# Patient Record
Sex: Female | Born: 1970 | Race: White | Hispanic: No | Marital: Married | Smoking: Current some day smoker
Health system: Southern US, Community
[De-identification: ages and names within clinical notes are randomized; demographics above are authoritative.]

## PROBLEM LIST (undated history)

## (undated) DIAGNOSIS — T7840XA Allergy, unspecified, initial encounter: Secondary | ICD-10-CM

## (undated) HISTORY — DX: Allergy, unspecified, initial encounter: T78.40XA

## (undated) HISTORY — PX: TUBAL LIGATION: SHX77

## (undated) HISTORY — PX: TONSILLECTOMY: SUR1361

---

## 2013-06-28 ENCOUNTER — Encounter: Payer: Self-pay | Admitting: Family Medicine

## 2013-06-28 ENCOUNTER — Ambulatory Visit (INDEPENDENT_AMBULATORY_CARE_PROVIDER_SITE_OTHER): Admitting: Family Medicine

## 2013-06-28 VITALS — BP 126/64 | HR 78 | Temp 98.2°F | Resp 16 | Wt 135.0 lb

## 2013-06-28 DIAGNOSIS — J302 Other seasonal allergic rhinitis: Secondary | ICD-10-CM | POA: Insufficient documentation

## 2013-06-28 DIAGNOSIS — J019 Acute sinusitis, unspecified: Secondary | ICD-10-CM

## 2013-06-28 MED ORDER — FLUTICASONE PROPIONATE 50 MCG/ACT NA SUSP: 2.0000 | Freq: Every day | NASAL | Status: DC

## 2013-06-28 MED ORDER — AMOXICILLIN-POT CLAVULANATE 875-125 MG PO TABS: 1.0000 | ORAL_TABLET | Freq: Two times a day (BID) | ORAL | Status: DC

## 2013-06-28 MED ORDER — FLUCONAZOLE 150 MG PO TABS: 150.0000 mg | ORAL_TABLET | Freq: Once | ORAL | Status: DC

## 2013-06-28 NOTE — Progress Notes (Signed)
  Subjective:    Patient ID: April Mcmillan, female    DOB: 11-11-1971, 42 y.o.   MRN: [id number]  HPI  Patient presents with 3 weeks of bilateral maxillary sinus pain and pressure. She has a history of chronic allergies. She is currently taking Zyrtec and over-the-counter with little relief. She's been trying over-the-counter Sudafed which has helped some.  She is also trying nasal saline 4 times a day with minimal relief. She denies any fevers or chills. She does report headaches. She denies any sore throat cough.  She is having itchy watery eyes and sneezing. Past Medical History  Diagnosis Date  . Allergy    No current outpatient prescriptions on file prior to visit.   No current facility-administered medications on file prior to visit.   No Known Allergies History   Social History  . Marital Status: Married    Spouse Name: N/A    Number of Children: N/A  . Years of Education: N/A   Occupational History  . Not on file.   Social History Main Topics  . Smoking status: Never Smoker   . Smokeless tobacco: Not on file  . Alcohol Use: Not on file  . Drug Use: Not on file  . Sexually Active: Not on file   Other Topics Concern  . Not on file   Social History Narrative  . No narrative on file     Review of Systems  All other systems reviewed and are negative.       Objective:   Physical Exam  Vitals reviewed. Constitutional: She appears well-developed and well-nourished.  HENT:  Right Ear: Tympanic membrane, external ear and ear canal normal.  Left Ear: Tympanic membrane, external ear and ear canal normal.  Nose: Mucosal edema and rhinorrhea present. Right sinus exhibits maxillary sinus tenderness and frontal sinus tenderness. Left sinus exhibits maxillary sinus tenderness and frontal sinus tenderness.  Mouth/Throat: Oropharynx is clear and moist.  Eyes: Conjunctivae are normal. Pupils are equal, round, and reactive to light.  Cardiovascular: Normal rate, regular  rhythm and normal heart sounds.   No murmur heard. Pulmonary/Chest: Effort normal and breath sounds normal. No respiratory distress. She has no wheezes. She has no rales.          Assessment & Plan:  1. Acute rhinosinusitis - amoxicillin-clavulanate (AUGMENTIN) 875-125 MG per tablet; Take 1 tablet by mouth 2 (two) times daily.  Dispense: 20 tablet; Refill: 0 - fluticasone (FLONASE) 50 MCG/ACT nasal spray; Place 2 sprays into the nose daily.  Dispense: 16 g; Refill: 6 Asked the patient continue nasal saline 4 times a day. I also asked her to discontinue Zyrtec and use Flonase instead to try to facilitate drainage of the sinuses.  Did call prescription for Diflucan to her pharmacy in case she gets a secondary yeast infection.

## 2013-09-07 ENCOUNTER — Ambulatory Visit (INDEPENDENT_AMBULATORY_CARE_PROVIDER_SITE_OTHER): Admitting: Family Medicine

## 2013-09-07 ENCOUNTER — Encounter: Payer: Self-pay | Admitting: Family Medicine

## 2013-09-07 VITALS — BP 128/78 | HR 78 | Temp 98.4°F | Resp 14 | Ht 63.0 in | Wt 130.0 lb

## 2013-09-07 DIAGNOSIS — J019 Acute sinusitis, unspecified: Secondary | ICD-10-CM | POA: Insufficient documentation

## 2013-09-07 MED ORDER — FLUTICASONE PROPIONATE 50 MCG/ACT NA SUSP: 2.0000 | Freq: Every day | NASAL | Status: DC

## 2013-09-07 MED ORDER — AMOXICILLIN-POT CLAVULANATE 875-125 MG PO TABS: 1.0000 | ORAL_TABLET | Freq: Two times a day (BID) | ORAL | Status: DC

## 2013-09-07 MED ORDER — FLUCONAZOLE 150 MG PO TABS: 150.0000 mg | ORAL_TABLET | Freq: Once | ORAL | Status: DC

## 2013-09-07 NOTE — Assessment & Plan Note (Signed)
Treat with Augmentin, decongestant, Flonase

## 2013-09-07 NOTE — Progress Notes (Signed)
  Subjective:    Patient ID: Lemoyne Scarpati, female    DOB: 03-02-1971, 42 y.o.   MRN: [id number]  HPI Patient here with sinus drainage and sinus pressure for the past 3 weeks. She has a history of severe allergies. She initially thought the symptoms were due to her allergies. Her mucus is now gone from thin clear to a thick yellow. She'll he has cough and she has mucus that goes down the back of her throat she needs to cough up. She denies any fever. She's used her Flonase a few days and also using her regular allergy medication.   Review of Systems  GEN- denies fatigue, fever, weight loss,weakness, recent illness HEENT- denies eye drainage, change in vision, +nasal discharge,+ear pressure CVS- denies chest pain, palpitations RESP- denies SOB, +cough, wheeze Neuro- denies headache, dizziness, syncope, seizure activity      Objective:   Physical Exam GEN- NAD, alert and oriented x3 HEENT- PERRL, EOMI, non injected sclera, pink conjunctiva, MMM, oropharynx clear, TM clear bilat no effusion,  + maxillary sinus tenderness, inflammed turbinates,  Nasal drainage  Neck- Supple, no LAD CVS- RRR, no murmur RESP-CTAB Pulses- Radial 2+         Assessment & Plan:

## 2013-09-07 NOTE — Patient Instructions (Signed)
Decongest with Sudafed You can take mucniex to thin the mucous Antibiotics prescribed Flonase Call if you do not improve

## 2013-11-17 ENCOUNTER — Ambulatory Visit (INDEPENDENT_AMBULATORY_CARE_PROVIDER_SITE_OTHER): Admitting: Physician Assistant

## 2013-11-17 ENCOUNTER — Encounter: Payer: Self-pay | Admitting: Physician Assistant

## 2013-11-17 VITALS — BP 116/70 | HR 72 | Temp 98.2°F | Resp 18 | Wt 131.0 lb

## 2013-11-17 DIAGNOSIS — J988 Other specified respiratory disorders: Secondary | ICD-10-CM

## 2013-11-17 DIAGNOSIS — J019 Acute sinusitis, unspecified: Secondary | ICD-10-CM

## 2013-11-17 DIAGNOSIS — A499 Bacterial infection, unspecified: Secondary | ICD-10-CM

## 2013-11-17 MED ORDER — FLUTICASONE PROPIONATE 50 MCG/ACT NA SUSP: 2.0000 | Freq: Every day | NASAL | Status: DC

## 2013-11-17 MED ORDER — CEFDINIR 300 MG PO CAPS: 300.0000 mg | ORAL_CAPSULE | Freq: Two times a day (BID) | ORAL | Status: DC

## 2013-11-17 NOTE — Progress Notes (Signed)
    Patient ID: April Mcmillan MRN: [id number], DOB: 1971-08-09, 42 y.o. Date of Encounter: 11/17/2013, 4:06 PM    Chief Complaint:  Chief Complaint  Patient presents with  . c/o sinus infection    facial pain, HA, congestion     HPI: 42 y.o. year old white female says she has been sick for 2-3 weeks. Not blowing much from nose. Coughing up a lot of phlegm. No sore throat, ear ache, fever, chills.     Home Meds: See attached medication section for any medications that were entered at today's visit. The computer does not put those onto this list.The following list is a list of meds entered prior to today's visit.   Current Outpatient Prescriptions on File Prior to Visit  Medication Sig Dispense Refill  . Phenylephrine-DM-GG-APAP (MUCINEX FAST-MAX COLD FLU) 5-10-200-325 MG TABS Take by mouth.       No current facility-administered medications on file prior to visit.    Allergies: No Known Allergies    Review of Systems: See HPI for pertinent ROS. All other ROS negative.    Physical Exam: Blood pressure 116/70, pulse 72, temperature 98.2 F (36.8 C), temperature source Oral, resp. rate 18, weight 131 lb (59.421 kg), last menstrual period 11/03/2013., Body mass index is 23.21 kg/(m^2). General: WNWD WF Appears in no acute distress. HEENT: Normocephalic, atraumatic, eyes without discharge, sclera non-icteric, nares are without discharge. Bilateral auditory canals clear, TM's are without perforation, pearly grey and translucent with reflective cone of light bilaterally. Oral cavity moist, posterior pharynx without exudate, erythema, peritonsillar abscess, or post nasal drip.No tenderness with percussion of sinuses.   Neck: Supple. No thyromegaly. No lymphadenopathy. Lungs: Clear bilaterally to auscultation without wheezes, rales, or rhonchi. Breathing is unlabored. Heart: Regular rhythm. No murmurs, rubs, or gallops. Msk:  Strength and tone normal for age. Extremities/Skin: Warm and  dry. No clubbing or cyanosis. No edema. No rashes or suspicious lesions. Neuro: Alert and oriented X 3. Moves all extremities spontaneously. Gait is normal. CNII-XII grossly in tact. Psych:  Responds to questions appropriately with a normal affect.     ASSESSMENT AND PLAN:  42 y.o. year old female with  1. Bacterial respiratory infection - cefdinir (OMNICEF) 300 MG capsule; Take 1 capsule (300 mg total) by mouth 2 (two) times daily.  Dispense: 20 capsule; Refill: 0 - fluticasone (FLONASE) 50 MCG/ACT nasal spray; Place 2 sprays into both nostrils daily.  Dispense: 16 g; Refill: 6  She says she doesnot think the Augmentin prescribed in October ever completely cleared up her infection.  She does not want ZPack.  Says she does not want a really strong abx b/c they "irritate her" Says needs something that lasts longer / stronger than ZPack Will try omnicef!!!  Signed, [address] [place], [place], [facility] 11/17/2013 4:06 PM

## 2014-01-31 ENCOUNTER — Encounter: Payer: Self-pay | Admitting: Family Medicine

## 2014-01-31 ENCOUNTER — Ambulatory Visit (INDEPENDENT_AMBULATORY_CARE_PROVIDER_SITE_OTHER): Admitting: Family Medicine

## 2014-01-31 VITALS — BP 100/68 | HR 74 | Temp 97.9°F | Resp 12 | Ht 63.0 in | Wt 138.0 lb

## 2014-01-31 DIAGNOSIS — J019 Acute sinusitis, unspecified: Secondary | ICD-10-CM

## 2014-01-31 MED ORDER — FLUCONAZOLE 150 MG PO TABS: 150.0000 mg | ORAL_TABLET | Freq: Once | ORAL | Status: DC

## 2014-01-31 MED ORDER — AMOXICILLIN-POT CLAVULANATE 875-125 MG PO TABS: 1.0000 | ORAL_TABLET | Freq: Two times a day (BID) | ORAL | Status: DC

## 2014-01-31 MED ORDER — PSEUDOEPHEDRINE-GUAIFENESIN ER 120-1200 MG PO TB12: 1.0000 | ORAL_TABLET | Freq: Two times a day (BID) | ORAL | Status: DC

## 2014-01-31 NOTE — Progress Notes (Signed)
   Subjective:    Patient ID: April Mcmillan, female    DOB: [birth year], 43 y.o.   MRN: [id number]  HPI Patient developed an upper respiratory illness approximately 10 days ago. It was characterized by congestion, rhinorrhea, and headache. Approximately 5 days ago the situation acutely worsened. She now has pain and pressure in her frontal and maxillary sinuses. She is having copious rhinorrhea and postnasal drip. She is having subjective fevers and sinus headaches. He is also having pain in her upper gums. He has a mild scratchy throat and a nonproductive cough due to the PND. Past Medical History  Diagnosis Date  . Allergy    Current Outpatient Prescriptions on File Prior to Visit  Medication Sig Dispense Refill  . fluticasone (FLONASE) 50 MCG/ACT nasal spray Place 2 sprays into both nostrils daily.  16 g  6  . loratadine (CLARITIN) 10 MG tablet Take 10 mg by mouth daily as needed for allergies.      . Phenylephrine-DM-GG-APAP (MUCINEX FAST-MAX COLD FLU) 5-10-200-325 MG TABS Take by mouth.       No current facility-administered medications on file prior to visit.   No Known Allergies History   Social History  . Marital Status: Married    Spouse Name: N/A    Number of Children: N/A  . Years of Education: N/A   Occupational History  . Not on file.   Social History Main Topics  . Smoking status: Never Smoker   . Smokeless tobacco: Not on file  . Alcohol Use: Not on file  . Drug Use: Not on file  . Sexual Activity: Not on file   Other Topics Concern  . Not on file   Social History Narrative  . No narrative on file      Review of Systems  All other systems reviewed and are negative.       Objective:   Physical Exam  Vitals reviewed. Constitutional: She appears well-developed and well-nourished.  HENT:  Right Ear: Tympanic membrane, external ear and ear canal normal.  Left Ear: Tympanic membrane, external ear and ear canal normal.  Nose: Mucosal edema and rhinorrhea  present. Right sinus exhibits maxillary sinus tenderness. Left sinus exhibits maxillary sinus tenderness.  Mouth/Throat: Oropharynx is clear and moist. No oropharyngeal exudate.  Neck: Neck supple.  Cardiovascular: Normal rate, regular rhythm and normal heart sounds.   Pulmonary/Chest: Effort normal and breath sounds normal. No respiratory distress. She has no wheezes. She has no rales.  Lymphadenopathy:    She has no cervical adenopathy.          Assessment & Plan:  1. Acute rhinosinusitis Begin Augmentin 875 mg by mouth twice a day for 10 days and Mucinex D 1 tablet every 12 hours as needed for congestion. I also recommended Flonase 2 sprays each nostril daily.  I also gave patient prescription for Diflucan in case the antibiotic gives her a yeast infection.   - amoxicillin-clavulanate (AUGMENTIN) 875-125 MG per tablet; Take 1 tablet by mouth 2 (two) times daily.  Dispense: 20 tablet; Refill: 0 - fluconazole (DIFLUCAN) 150 MG tablet; Take 1 tablet (150 mg total) by mouth once.  Dispense: 1 tablet; Refill: 0 - Pseudoephedrine-Guaifenesin (MUCINEX D) [phone] MG TB12; Take 1 tablet by mouth 2 (two) times daily.  Dispense: 20 each; Refill: 0

## 2014-03-27 ENCOUNTER — Encounter: Payer: Self-pay | Admitting: Family Medicine

## 2014-03-27 ENCOUNTER — Ambulatory Visit (INDEPENDENT_AMBULATORY_CARE_PROVIDER_SITE_OTHER): Admitting: Family Medicine

## 2014-03-27 VITALS — BP 114/70 | HR 78 | Temp 98.1°F | Resp 16 | Ht 61.0 in | Wt 138.0 lb

## 2014-03-27 DIAGNOSIS — T7840XA Allergy, unspecified, initial encounter: Secondary | ICD-10-CM

## 2014-03-27 DIAGNOSIS — J309 Allergic rhinitis, unspecified: Secondary | ICD-10-CM

## 2014-03-27 DIAGNOSIS — J329 Chronic sinusitis, unspecified: Secondary | ICD-10-CM

## 2014-03-27 DIAGNOSIS — J302 Other seasonal allergic rhinitis: Secondary | ICD-10-CM

## 2014-03-27 DIAGNOSIS — J019 Acute sinusitis, unspecified: Secondary | ICD-10-CM

## 2014-03-27 MED ORDER — AMOXICILLIN-POT CLAVULANATE 875-125 MG PO TABS: 1.0000 | ORAL_TABLET | Freq: Two times a day (BID) | ORAL | Status: DC

## 2014-03-27 MED ORDER — OLOPATADINE HCL 0.2 % OP SOLN: OPHTHALMIC | Status: DC

## 2014-03-27 MED ORDER — FLUTICASONE PROPIONATE 50 MCG/ACT NA SUSP: 2.0000 | Freq: Every day | NASAL | Status: DC

## 2014-03-27 MED ORDER — FEXOFENADINE-PSEUDOEPHED ER 180-240 MG PO TB24: 1.0000 | ORAL_TABLET | Freq: Every day | ORAL | Status: DC

## 2014-03-27 MED ORDER — FLUCONAZOLE 150 MG PO TABS: 150.0000 mg | ORAL_TABLET | Freq: Once | ORAL | Status: DC

## 2014-03-27 NOTE — Progress Notes (Signed)
Patient ID: April Mcmillan, female   DOB: Oct 31, 1971, 43 y.o.   MRN: [id number]   Subjective:    Patient ID: April Mcmillan, female    DOB: [birth year], 43 y.o.   MRN: [id number]  Patient presents for Sinus infection  patient here for sinus drainage pressure for the past 2 weeks. She's history recurrent sinus infection she did see ear nose and throat in the past in different locations secondary to her husband being in the [employer]. She also has problems with seasonal allergies. She's been taking a host of different medications to try to control her symptoms from over-the-counter antihistamine, Allegra Zyrtec to Flonase to Tylenol sinus and cough relief as well as Mucinex D. She also takes Benadryl some nights when she has itching. She's not had any fever. She does get some postnasal drip but no cough.    Review Of Systems:  GEN- denies fatigue, fever, weight loss,weakness, recent illness HEENT- denies eye drainage, change in vision, +nasal discharge, CVS- denies chest pain, palpitations RESP- denies SOB, cough, wheeze Neuro- denies headache, dizziness, syncope, seizure activity       Objective:    BP 114/70  Pulse 78  Temp(Src) 98.1 F (36.7 C) (Oral)  Resp 16  Ht 5\' 1"  (1.549 m)  Wt 138 lb (62.596 kg)  BMI 26.09 kg/m2  LMP 03/15/2014 GEN- NAD, alert and oriented x3 HEENT- PERRL, EOMI, non injected sclera, pink conjunctiva, MMM, oropharynx clear, TM clear bilat no effusion,  + maxillary sinus tenderness, inflammed turbinates,  Nasal drainage  Neck- Supple, no LAD CVS- RRR, no murmur RESP-CTAB EXT- No edema Pulses- Radial 2+         Assessment & Plan:      Problem List Items Addressed This Visit   Recurrent sinus infections   Relevant Medications      fexofenadine-pseudoephedrine (ALLEGRA-D 24) 180-240 MG per 24 hr tablet      AMOXICILLIN-POT CLAVULANATE 875-125 MG PO TABS      fluconazole (DIFLUCAN) tablet 150 mg      fluticasone (FLONASE) 50 MCG/ACT nasal spray     Other Visit Diagnoses   Acute rhinosinusitis    -  Primary    Relevant Medications       fexofenadine-pseudoephedrine (ALLEGRA-D 24) 180-240 MG per 24 hr tablet       AMOXICILLIN-POT CLAVULANATE 875-125 MG PO TABS       fluconazole (DIFLUCAN) tablet 150 mg       fluticasone (FLONASE) 50 MCG/ACT nasal spray       Note: This dictation was prepared with Dragon dictation along with smaller phrase technology. Any transcriptional errors that result from this process are unintentional.

## 2014-03-27 NOTE — Assessment & Plan Note (Signed)
Per above give Allegra-D, Flonase, had a day as needed for eye allergies

## 2014-03-27 NOTE — Patient Instructions (Signed)
Take the allegra D and flonase  Plain mucinex twice a day  Take the antibiotics as prescribed CT scan of maxillary sinuses  Refer to ENT

## 2014-03-27 NOTE — Assessment & Plan Note (Signed)
Send to ENT for evaluation CT of maxillary face to be done to rule out abscess or other chronic sinus changes.

## 2014-03-27 NOTE — Assessment & Plan Note (Signed)
I will go ahead and treat this acute infection with Augmentin as she does do well with this. I've also given her Allegra-D and she can take plain Mucinex one tablet twice a day samples were given from the office. She will also continue the Flonase. I am concerned about her recurrent infections therefore she we will refer to ear nose and throat appear

## 2014-04-05 ENCOUNTER — Ambulatory Visit
Admission: RE | Admit: 2014-04-05 | Discharge: 2014-04-05 | Disposition: A | Discharge status: Home / Self Care | Source: Ambulatory Visit | Attending: Family Medicine | Admitting: Family Medicine

## 2014-04-05 DIAGNOSIS — J329 Chronic sinusitis, unspecified: Secondary | ICD-10-CM

## 2014-04-07 NOTE — Progress Notes (Signed)
LMTRC

## 2014-06-19 ENCOUNTER — Encounter: Payer: Self-pay | Admitting: Family Medicine

## 2014-06-19 ENCOUNTER — Ambulatory Visit (INDEPENDENT_AMBULATORY_CARE_PROVIDER_SITE_OTHER): Admitting: Family Medicine

## 2014-06-19 VITALS — BP 110/76 | HR 80 | Temp 97.1°F | Resp 16 | Ht 61.0 in | Wt 137.0 lb

## 2014-06-19 DIAGNOSIS — J329 Chronic sinusitis, unspecified: Secondary | ICD-10-CM

## 2014-06-19 DIAGNOSIS — J310 Chronic rhinitis: Secondary | ICD-10-CM

## 2014-06-19 MED ORDER — PREDNISONE 20 MG PO TABS
ORAL_TABLET | ORAL | Status: DC
Start: 2014-06-19 — End: 2014-06-27

## 2014-06-19 MED ORDER — CEFDINIR 300 MG PO CAPS: 300.0000 mg | ORAL_CAPSULE | Freq: Two times a day (BID) | ORAL | Status: DC

## 2014-06-19 NOTE — Progress Notes (Signed)
Subjective:    Patient ID: April Mcmillan, female    DOB: October 07, 1971, 43 y.o.   MRN: [id number]  HPI Patient states that she has a sinus infection. She complains of pain and pressure behind her nasal bridge that is constant. The pain is throbbing in intensity. It sometimes causes her to have severe headaches and photosensitivity. It causes he to feel tired and weak. Occasionally makes her have a hard time even getting out of bed in the morning. She had a CT scan of the sinuses in April which were clear. She saw ENT doctor last week for a CT scan of the sinuses which was also clear. ENT doctor stated he could find no evidence of a sinus infection and did not recommend any sinus surgeries. The patient is convinced she is having a sinus infection. She does have a sore throat and some postnasal drip although this is mild.  The headache has been constant for almost 2 months. It waxes and wanes. She does have a history of migraines. The headache does cause photosensitivity and is pulsatile and recurrent. The patient is on Allegra, Flonase, Mucinex for chronic allergies. She has seen an allergy specialist in the past for allergy shots but continued to have recurrent "sinus headaches" requiring antibiotics. Patient states with antibiotics her headaches will temporarily improve but then quickly return.  She also believes she may have a yeast infection around her rectum. She states that it itches around her rectum. She has been having diarrhea recently. She denies any vaginal discharge. She complains of itching and irritation in her perineum. Past Medical History  Diagnosis Date  . Allergy    No past surgical history on file. Current Outpatient Prescriptions on File Prior to Visit  Medication Sig Dispense Refill  . fexofenadine-pseudoephedrine (ALLEGRA-D 24) 180-240 MG per 24 hr tablet Take 1 tablet by mouth daily.  30 tablet  11  . fluticasone (FLONASE) 50 MCG/ACT nasal spray Place 2 sprays into both nostrils  daily.  16 g  6  . Olopatadine HCl 0.2 % SOLN 1 drop each eye once a day for allergies  1 Bottle  3   No current facility-administered medications on file prior to visit.   No Known Allergies History   Social History  . Marital Status: Married    Spouse Name: N/A    Number of Children: N/A  . Years of Education: N/A   Occupational History  . Not on file.   Social History Main Topics  . Smoking status: Current Some Day Smoker -- 0.50 packs/day    Types: Cigarettes  . Smokeless tobacco: Never Used  . Alcohol Use: No     Comment: rare -ocassionally  . Drug Use: No  . Sexual Activity: Yes   Other Topics Concern  . Not on file   Social History Narrative  . No narrative on file      Review of Systems  All other systems reviewed and are negative.      Objective:   Physical Exam  Vitals reviewed. Constitutional: She is oriented to person, place, and time. She appears well-developed and well-nourished.  HENT:  Right Ear: External ear normal.  Left Ear: External ear normal.  Nose: Nose normal.  Mouth/Throat: Oropharynx is clear and moist. No oropharyngeal exudate.  Eyes: Conjunctivae and EOM are normal. Pupils are equal, round, and reactive to light. No scleral icterus.  Neck: Neck supple.  Cardiovascular: Normal rate, regular rhythm and normal heart sounds.   Pulmonary/Chest: Effort normal  and breath sounds normal. No respiratory distress. She has no wheezes. She has no rales.  Lymphadenopathy:    She has no cervical adenopathy.  Neurological: She is alert and oriented to person, place, and time. No cranial nerve deficit. She exhibits normal muscle tone. Coordination normal.  Skin: She is not diaphoretic.   patient does have a deviated septum. However there is no mucosal inflammation. There is no rhinorrhea in the naris. Examination of the tympanic membranes reveals no effusion bilaterally.        Assessment & Plan:  1. Rhinosinusitis I do not believe this is a  sinus infection.  It is chronic and recurrent despite maximum therapy for allergies, including allergy shots. She has had 2 separate CT scans of the sinuses over the last 2 months revealing no sinus infection or sinus inflammation during the time she has had chronic symptoms. I explained this to the patient and she seems extremely frustrated and fixated on the fact that this is a sinus infection. I do believe it's possible she could have chronic allergies creating sinus pressure and headaches. Therefore I would like to temporarily try prednisone to see if it will improve her allergies and alleviate her symptoms. At the patient's request I will treat her as a possible sinus infection with Omnicef. However I asked the patient to return in one week. If her symptoms are not improving at all I would explore other possible causes of chronic intermittent headaches including possible migraine headache. I examined her rectum and perineum and see no evidence of a topical yeast infection today. A chaperone was present throughout the entire exam. I believe the perirectal itching is due to the diarrhea. I have recommended Imodium for diarrhea. I have recommended using wet wipes after bowel movements to remove any residual fecal debris that may cause itching. I emphasized to the patient to return in one week to see if her symptoms improve with treatment for "sinus."  If not I believe we need to explore other possible causes of her headaches. - predniSONE (DELTASONE) 20 MG tablet; 3 tabs poqday 1-2, 2 tabs poqday 3-4, 1 tab poqday 5-6  Dispense: 12 tablet; Refill: 0 - cefdinir (OMNICEF) 300 MG capsule; Take 1 capsule (300 mg total) by mouth 2 (two) times daily.  Dispense: 20 capsule; Refill: 0

## 2014-06-20 ENCOUNTER — Encounter: Payer: Self-pay | Admitting: Family Medicine

## 2014-06-27 ENCOUNTER — Ambulatory Visit (INDEPENDENT_AMBULATORY_CARE_PROVIDER_SITE_OTHER): Admitting: Family Medicine

## 2014-06-27 ENCOUNTER — Encounter: Payer: Self-pay | Admitting: Family Medicine

## 2014-06-27 VITALS — BP 140/80 | HR 84 | Temp 98.1°F | Resp 16 | Ht 61.0 in | Wt 138.0 lb

## 2014-06-27 DIAGNOSIS — G43111 Migraine with aura, intractable, with status migrainosus: Secondary | ICD-10-CM

## 2014-06-27 DIAGNOSIS — G43101 Migraine with aura, not intractable, with status migrainosus: Secondary | ICD-10-CM

## 2014-06-27 MED ORDER — TOPIRAMATE 25 MG PO TABS: 50.0000 mg | ORAL_TABLET | Freq: Two times a day (BID) | ORAL | Status: DC

## 2014-06-27 NOTE — Progress Notes (Signed)
Subjective:    Patient ID: April Mcmillan, female    DOB: 03-Jul-1971, 43 y.o.   MRN: [id number]  HPI 06/19/14 Patient states that she has a sinus infection. She complains of pain and pressure behind her nasal bridge that is constant. The pain is throbbing in intensity. It sometimes causes her to have severe headaches and photosensitivity. It causes he to feel tired and weak. Occasionally makes her have a hard time even getting out of bed in the morning. She had a CT scan of the sinuses in April which were clear. She saw ENT doctor last week for a CT scan of the sinuses which was also clear. ENT doctor stated he could find no evidence of a sinus infection and did not recommend any sinus surgeries. The patient is convinced she is having a sinus infection. She does have a sore throat and some postnasal drip although this is mild.  The headache has been constant for almost 2 months. It waxes and wanes. She does have a history of migraines. The headache does cause photosensitivity and is pulsatile and recurrent. The patient is on Allegra, Flonase, Mucinex for chronic allergies. She has seen an allergy specialist in the past for allergy shots but continued to have recurrent "sinus headaches" requiring antibiotics. Patient states with antibiotics her headaches will temporarily improve but then quickly return.  She also believes she may have a yeast infection around her rectum. She states that it itches around her rectum. She has been having diarrhea recently. She denies any vaginal discharge. She complains of itching and irritation in her perineum.  At that time, my plan was: 1. Rhinosinusitis I do not believe this is a sinus infection.  It is chronic and recurrent despite maximum therapy for allergies, including allergy shots. She has had 2 separate CT scans of the sinuses over the last 2 months revealing no sinus infection or sinus inflammation during the time she has had chronic symptoms. I explained this to the  patient and she seems extremely frustrated and fixated on the fact that this is a sinus infection. I do believe it's possible she could have chronic allergies creating sinus pressure and headaches. Therefore I would like to temporarily try prednisone to see if it will improve her allergies and alleviate her symptoms. At the patient's request I will treat her as a possible sinus infection with Omnicef. However I asked the patient to return in one week. If her symptoms are not improving at all I would explore other possible causes of chronic intermittent headaches including possible migraine headache. I examined her rectum and perineum and see no evidence of a topical yeast infection today. A chaperone was present throughout the entire exam. I believe the perirectal itching is due to the diarrhea. I have recommended Imodium for diarrhea. I have recommended using wet wipes after bowel movements to remove any residual fecal debris that may cause itching. I emphasized to the patient to return in one week to see if her symptoms improve with treatment for "sinus."  If not I believe we need to explore other possible causes of her headaches. - predniSONE (DELTASONE) 20 MG tablet; 3 tabs poqday 1-2, 2 tabs poqday 3-4, 1 tab poqday 5-6  Dispense: 12 tablet; Refill: 0 - cefdinir (OMNICEF) 300 MG capsule; Take 1 capsule (300 mg total) by mouth 2 (two) times daily.  Dispense: 20 capsule; Refill: 0  06/27/14 Patient symptoms did not improve. She never took [place]. She did take prednisone. Her headaches improved  on higher dose of prednisone although she continues to have frontal and retro-orbital pulsatile headaches. She also reports scotoma and scintillating lines prior to her headaches starting. Headaches continue to occur on a daily basis. She also continues to have photophobia. She has noticed that strong smells seem to trigger her headaches. She has a family history of migraines in her sister. Past Medical History    Diagnosis Date  . Allergy    No past surgical history on file. Current Outpatient Prescriptions on File Prior to Visit  Medication Sig Dispense Refill  . fexofenadine-pseudoephedrine (ALLEGRA-D 24) 180-240 MG per 24 hr tablet Take 1 tablet by mouth daily.  30 tablet  11  . fluticasone (FLONASE) 50 MCG/ACT nasal spray Place 2 sprays into both nostrils daily.  16 g  6  . Olopatadine HCl 0.2 % SOLN 1 drop each eye once a day for allergies  1 Bottle  3   No current facility-administered medications on file prior to visit.   No Known Allergies History   Social History  . Marital Status: Married    Spouse Name: N/A    Number of Children: N/A  . Years of Education: N/A   Occupational History  . Not on file.   Social History Main Topics  . Smoking status: Current Some Day Smoker -- 0.50 packs/day    Types: Cigarettes  . Smokeless tobacco: Never Used  . Alcohol Use: No     Comment: rare -ocassionally  . Drug Use: No  . Sexual Activity: Yes   Other Topics Concern  . Not on file   Social History Narrative  . No narrative on file      Review of Systems  All other systems reviewed and are negative.      Objective:   Physical Exam  Vitals reviewed. Constitutional: She is oriented to person, place, and time. She appears well-developed and well-nourished.  HENT:  Right Ear: External ear normal.  Left Ear: External ear normal.  Nose: Nose normal.  Mouth/Throat: Oropharynx is clear and moist. No oropharyngeal exudate.  Eyes: Conjunctivae and EOM are normal. Pupils are equal, round, and reactive to light. No scleral icterus.  Neck: Neck supple.  Cardiovascular: Normal rate, regular rhythm and normal heart sounds.   Pulmonary/Chest: Effort normal and breath sounds normal. No respiratory distress. She has no wheezes. She has no rales.  Lymphadenopathy:    She has no cervical adenopathy.  Neurological: She is alert and oriented to person, place, and time. No cranial nerve  deficit. She exhibits normal muscle tone. Coordination normal.  Skin: She is not diaphoretic.   patient does have a deviated septum. However there is no mucosal inflammation. There is no rhinorrhea in the naris. Examination of the tympanic membranes reveals no effusion bilaterally.        Assessment & Plan:  1. Migraine with aura and with status migrainosus, not intractable With the patient's chronic headaches or not sinuses but I rather migraines. I recommend starting the patient on Topamax 25 mg by mouth each bedtime. Increase her dose by 25 mg daily every week until she is eventually taking 50 mg by mouth twice a day after four weeks.  Also, refer the patient to a neurologist for second opinion per her request. Recheck after one month. - topiramate (TOPAMAX) 25 MG tablet; Take 2 tablets (50 mg total) by mouth 2 (two) times daily.  Dispense: 120 tablet; Refill: 1 - Ambulatory referral to Neurology

## 2014-07-07 ENCOUNTER — Encounter: Payer: Self-pay | Admitting: Neurology

## 2014-07-07 ENCOUNTER — Ambulatory Visit (INDEPENDENT_AMBULATORY_CARE_PROVIDER_SITE_OTHER): Admitting: Neurology

## 2014-07-07 VITALS — BP 109/70 | HR 83 | Ht 62.5 in | Wt 138.0 lb

## 2014-07-07 DIAGNOSIS — M5090 Cervical disc disorder, unspecified, unspecified cervical region: Secondary | ICD-10-CM

## 2014-07-07 DIAGNOSIS — G43909 Migraine, unspecified, not intractable, without status migrainosus: Secondary | ICD-10-CM

## 2014-07-07 LAB — COMPREHENSIVE METABOLIC PANEL
ALT: 12 IU/L (ref 0–32)
AST: 15 IU/L (ref 0–40)
Albumin/Globulin Ratio: 1.8 (ref 1.1–2.5)
Albumin: 4.1 g/dL (ref 3.5–5.5)
Alkaline Phosphatase: 56 IU/L (ref 39–117)
BUN/Creatinine Ratio: 11 (ref 9–23)
BUN: 8 mg/dL (ref 6–24)
CALCIUM: 9.5 mg/dL (ref 8.7–10.2)
CO2: 26 mmol/L (ref 18–29)
CREATININE: 0.71 mg/dL (ref 0.57–1.00)
Chloride: 104 mmol/L (ref 96–108)
GFR calc Af Amer: 120 mL/min/{1.73_m2} (ref >59)
GFR calc non Af Amer: 104 mL/min/{1.73_m2} (ref >59)
GLOBULIN, TOTAL: 2.3 g/dL (ref 1.5–4.5)
Glucose: 77 mg/dL (ref 65–99)
Potassium: 3.8 mmol/L (ref 3.5–5.2)
SODIUM: 138 mmol/L (ref 134–144)
Total Bilirubin: 0.3 mg/dL (ref 0.0–1.2)
Total Protein: 6.4 g/dL (ref 6.0–8.5)

## 2014-07-07 NOTE — Progress Notes (Signed)
[facility]    Provider:  Dr Janann [place] Referring Provider: Susy Frizzle, MD Primary Care Physician:  Odette Fraction, MD  CC:  headaches  HPI:  April Mcmillan is a 43 y.o. female here as a referral from Dr. Dennard Schaumann for headache evaluation  Currently having headaches every 1 to 2 weeks. Can last hours to all day. Headache is frontal, pounding type pain. Gets nausea. + Photo and phonophobia. Gets blurry vision with the headache. Gets light headed sensation during the headache. No focal motor or sensory changes with the headache. Notes some neck pain starting in her cervical region. Notes some radiating numbness going down both arms in no dermatomal distribution. Has some history of neck trauma. No aura prior to headache. After the headache did note some crooked lines in her vision.  Headaches can be triggered by dust, perfumes, certain soaps.  Recently started on Topamax. Currently taking 25mg  at night with goal of 50mg  twice a day.    Evaluated by ENT for suspected sinus infections but was told that they are not sinus related. Sister has migraines.   Review of Systems: Out of a complete 14 system review, the patient complains of only the following symptoms, and all other reviewed systems are negative + fever, chills, fatigue, blurred viison, double vision, memory loss, confusion, headache  History   Social History  . Marital Status: Married    Spouse Name: April Mcmillan     Number of Children: 4  . Years of Education: 12+   Occupational History  . Student     Social History Main Topics  . Smoking status: Current Some Day Smoker -- 0.50 packs/day    Types: Cigarettes  . Smokeless tobacco: Never Used  . Alcohol Use: Yes     Comment: rare -ocassionally  . Drug Use: No  . Sexual Activity: Yes   Other Topics Concern  . Not on file   Social History Narrative   Patient lives at home with husband April Mcmillan    Patient has 4 children    Patient has 12+ years of  education.    Patient is right handed.           History reviewed. No pertinent family history.  Past Medical History  Diagnosis Date  . Allergy     History reviewed. No pertinent past surgical history.  Current Outpatient Prescriptions  Medication Sig Dispense Refill  . fexofenadine (ALLEGRA) 180 MG tablet Take 180 mg by mouth daily.      . fluticasone (FLONASE) 50 MCG/ACT nasal spray Place 2 sprays into both nostrils as needed.      . Olopatadine HCl 0.2 % SOLN 1 drop each eye once a day for allergies  1 Bottle  3  . topiramate (TOPAMAX) 25 MG tablet Take 2 tablets (50 mg total) by mouth 2 (two) times daily.  120 tablet  1   No current facility-administered medications for this visit.    Allergies as of 07/07/2014  . (No Known Allergies)    Vitals: BP 109/70  Pulse 83  Ht 5' 2.5" (1.588 m)  Wt 138 lb (62.596 kg)  BMI 24.82 kg/m2 Last Weight:  Wt Readings from Last 1 Encounters:  07/07/14 138 lb (62.596 kg)   Last Height:   Ht Readings from Last 1 Encounters:  07/07/14 5' 2.5" (1.588 m)     Physical exam: Exam: Gen: NAD, conversant Eyes: anicteric sclerae, moist conjunctivae HENT: Atraumatic, oropharynx clear Neck: Trachea midline; supple,  Lungs: CTA, no wheezing,  rales, rhonic                          CV: RRR, no MRG Abdomen: Soft, non-tender;  Extremities: No peripheral edema  Skin: Normal temperature, no rash,  Psych: Appropriate affect, pleasant  Neuro: MS: AA&Ox3, appropriately interactive, normal affect   Attention: WORLD backwards  Speech: fluent w/o paraphasic error  Memory: good recent and remote recall  CN: PERRL, VFF to FC bilat, fundoscopic exam wnl bilat, EOMI no nystagmus, no ptosis, sensation intact to LT V1-V3 bilat, face symmetric, no weakness, hearing grossly intact, palate elevates symmetrically, shoulder shrug 5/5 bilat,  tongue protrudes midline, no fasiculations noted.  Motor: normal bulk and tone Strength: 5/5  In all  extremities  Coord: rapid alternating and point-to-point (FNF, HTS) movements intact.  Reflexes: symmetrical, bilat downgoing toes  Sens: LT intact in all extremities  Gait: posture, stance, stride and arm-swing normal. Tandem gait intact. Able to walk on heels and toes. Romberg absent.   Assessment:  After physical and neurologic examination, review of laboratory studies, imaging, neurophysiology testing and pre-existing records, assessment will be reviewed on the problem list.  Plan:  Treatment plan and additional workup will be reviewed under Problem List.  1)Headache 2)Cervical neck pain  42y/o woman presenting for initial evaluation of headache. Based on description this is most consistent with a diagnosis if migraine without aura. Suspect she may also have a component of cervicogenic headache. Continue on topamax with goal to titrate up to 50mg  bid. Will check MRI brain and Cervical spine X-ray. Will refer to PT for possible cervical traction. Follow up once MRI completed.   Jim Like, DO  [facility] [address] [place], [place] [address]  Phone [phone] Fax [phone]

## 2014-07-07 NOTE — Patient Instructions (Signed)
Overall you are doing fairly well but I do want to suggest a few things today:   Remember to drink plenty of fluid, eat healthy meals and do not skip any meals. Try to eat protein with a every meal and eat a healthy snack such as fruit or nuts in between meals. Try to keep a regular sleep-wake schedule and try to exercise daily, particularly in the form of walking, 20-30 minutes a day, if you can.   As far as your medications are concerned, I would like to suggest you continue on the topamax for now.   As far as diagnostic testing:  1)Please have some blood work drawn today 2)I would like you to have a MRI of the brain. You will be called to schedule this 3)I would like you to have a x-ray of the cervical spine. You can go to [facility] imaging to have this completed  Please work with physical therapy, you will be called to schedule this  Please call us with any interim questions, concerns, problems, updates or refill requests.   My clinical assistant and will answer any of your questions and relay your messages to me and also relay most of my messages to you.   Our phone number is [phone]. We also have an after hours call service for urgent matters and there is a physician on-call for urgent questions. For any emergencies you know to call 911 or go to the nearest emergency room

## 2014-07-10 ENCOUNTER — Telehealth: Payer: Self-pay | Admitting: *Deleted

## 2014-07-10 NOTE — Progress Notes (Signed)
Quick Note:  Shared normal labs with patient thru VM message ______

## 2014-07-10 NOTE — Telephone Encounter (Signed)
Returned patients call. Discussed potential side effects of Topamax, including extremity paresthesias. She expressed understanding. Will continue on low dose Topamax for another week, if symptoms persist will consider discontinuing and trying alternative agent. Will follow up once MRI brain completed.

## 2014-07-10 NOTE — Telephone Encounter (Signed)
Patient said that she is having problems with shoulder, arms, neck, feeling of pins/needles in feet,toes,calf (turning blue for a minute, goes back to normal), dizziness, really bad. It started last week. She wanted to know if this could be coming from the migraines or the medication causing seratonin syndrome(too much)    Can she take vitamins and consume 1-2 drinks of alcohol  While taking the topamax?

## 2014-07-11 ENCOUNTER — Ambulatory Visit
Admission: RE | Admit: 2014-07-11 | Discharge: 2014-07-11 | Disposition: A | Discharge status: Home / Self Care | Source: Ambulatory Visit | Attending: Neurology | Admitting: Neurology

## 2014-07-11 DIAGNOSIS — M5090 Cervical disc disorder, unspecified, unspecified cervical region: Secondary | ICD-10-CM

## 2014-07-11 DIAGNOSIS — G43909 Migraine, unspecified, not intractable, without status migrainosus: Secondary | ICD-10-CM

## 2014-07-18 ENCOUNTER — Ambulatory Visit
Admission: RE | Admit: 2014-07-18 | Discharge: 2014-07-18 | Disposition: A | Discharge status: Home / Self Care | Source: Ambulatory Visit | Attending: Neurology | Admitting: Neurology

## 2014-07-18 DIAGNOSIS — R51 Headache: Secondary | ICD-10-CM

## 2014-07-18 DIAGNOSIS — M5090 Cervical disc disorder, unspecified, unspecified cervical region: Secondary | ICD-10-CM

## 2014-07-18 DIAGNOSIS — G43909 Migraine, unspecified, not intractable, without status migrainosus: Secondary | ICD-10-CM

## 2014-07-20 ENCOUNTER — Telehealth: Payer: Self-pay

## 2014-07-20 NOTE — Telephone Encounter (Addendum)
This message has been taking care of please see previous Telephone call.

## 2014-07-20 NOTE — Progress Notes (Signed)
Quick Note:  Called and left patient a message normal MRI continue on Topamax. ______

## 2014-10-12 ENCOUNTER — Encounter: Payer: Self-pay | Admitting: Physician Assistant

## 2014-10-12 ENCOUNTER — Ambulatory Visit (INDEPENDENT_AMBULATORY_CARE_PROVIDER_SITE_OTHER): Admitting: Physician Assistant

## 2014-10-12 VITALS — BP 112/76 | HR 68 | Temp 98.2°F | Resp 18 | Wt 130.0 lb

## 2014-10-12 DIAGNOSIS — B9689 Other specified bacterial agents as the cause of diseases classified elsewhere: Secondary | ICD-10-CM

## 2014-10-12 DIAGNOSIS — G43101 Migraine with aura, not intractable, with status migrainosus: Secondary | ICD-10-CM

## 2014-10-12 DIAGNOSIS — J069 Acute upper respiratory infection, unspecified: Secondary | ICD-10-CM

## 2014-10-12 MED ORDER — AMOXICILLIN-POT CLAVULANATE 875-125 MG PO TABS: 1.0000 | ORAL_TABLET | Freq: Two times a day (BID) | ORAL | Status: DC

## 2014-10-12 MED ORDER — TOPIRAMATE 25 MG PO TABS: 50.0000 mg | ORAL_TABLET | Freq: Two times a day (BID) | ORAL | Status: DC

## 2014-10-12 NOTE — Progress Notes (Signed)
    Patient ID: April Mcmillan MRN: [id number], DOB: 1970/12/23, 43 y.o. Date of Encounter: 10/12/2014, 3:58 PM    Chief Complaint:  Chief Complaint  Patient presents with  . sick 1-2 weeks    c/o ear/sinus infection     HPI: 43 y.o. year old white female presents with above symptoms. Has a lot of pressure in her sinus areas. A lot of mucus from her nose. Has had some postnasal drainage to her throat but is having no chest congestion. No significant sore throat. Has had no fevers or chills.     Home Meds:   Outpatient Prescriptions Prior to Visit  Medication Sig Dispense Refill  . fluticasone (FLONASE) 50 MCG/ACT nasal spray Place 2 sprays into both nostrils as needed.    . Olopatadine HCl 0.2 % SOLN 1 drop each eye once a day for allergies 1 Bottle 3  . topiramate (TOPAMAX) 25 MG tablet Take 2 tablets (50 mg total) by mouth 2 (two) times daily. 120 tablet 1  . fexofenadine (ALLEGRA) 180 MG tablet Take 180 mg by mouth daily.     No facility-administered medications prior to visit.    Allergies: No Known Allergies    Review of Systems: See HPI for pertinent ROS. All other ROS negative.    Physical Exam: Blood pressure 112/76, pulse 68, temperature 98.2 F (36.8 C), temperature source Oral, resp. rate 18, weight 130 lb (58.968 kg)., Body mass index is 23.38 kg/(m^2). General:  WNWD WF. Appears in no acute distress. HEENT: Normocephalic, atraumatic, eyes without discharge, sclera non-icteric, nares are without discharge. Bilateral auditory canals clear, TM's are without perforation, pearly grey and translucent with reflective cone of light bilaterally. Oral cavity moist, posterior pharynx without exudate, erythema, peritonsillar abscess. Minimal tenderness with percussion of frontal sinuses and maxillary sinuses bilaterally. She reports that it's mostly  " just pressure behind there." Neck: Supple. No thyromegaly. No lymphadenopathy. Lungs: Clear bilaterally to auscultation without  wheezes, rales, or rhonchi. Breathing is unlabored. Heart: Regular rhythm. No murmurs, rubs, or gallops. Msk:  Strength and tone normal for age. Extremities/Skin: Warm and dry.  No rashes. Neuro: Alert and oriented X 3. Moves all extremities spontaneously. Gait is normal. CNII-XII grossly in tact. Psych:  Responds to questions appropriately with a normal affect.     ASSESSMENT AND PLAN:  43 y.o. year old female with  1. Bacterial upper respiratory infection - amoxicillin-clavulanate (AUGMENTIN) 875-125 MG per tablet; Take 1 tablet by mouth 2 (two) times daily.  Dispense: 20 tablet; Refill: 0  She is to take antibiotics as directed to complete all of it. Can use over-the-counter decongestants as needed for symptom relief. Follow-up if symptoms do not resolve with completion of antibiotic.  Marin Olp [place], [place], [facility] 10/12/2014 3:58 PM

## 2015-01-05 ENCOUNTER — Ambulatory Visit: Admitting: Family Medicine

## 2015-01-05 ENCOUNTER — Encounter: Payer: Self-pay | Admitting: Family Medicine

## 2015-01-05 ENCOUNTER — Ambulatory Visit (INDEPENDENT_AMBULATORY_CARE_PROVIDER_SITE_OTHER): Admitting: Family Medicine

## 2015-01-05 VITALS — BP 104/70 | HR 84 | Temp 98.0°F | Resp 18 | Wt 142.0 lb

## 2015-01-05 DIAGNOSIS — J019 Acute sinusitis, unspecified: Secondary | ICD-10-CM

## 2015-01-05 MED ORDER — FLUTICASONE PROPIONATE 50 MCG/ACT NA SUSP: 2.0000 | NASAL | Status: DC | PRN

## 2015-01-05 MED ORDER — FLUCONAZOLE 150 MG PO TABS: 150.0000 mg | ORAL_TABLET | Freq: Once | ORAL | Status: DC

## 2015-01-05 MED ORDER — AMOXICILLIN-POT CLAVULANATE 875-125 MG PO TABS: 1.0000 | ORAL_TABLET | Freq: Two times a day (BID) | ORAL | Status: DC

## 2015-01-05 NOTE — Progress Notes (Signed)
   Subjective:    Patient ID: April Mcmillan, female    DOB: 12/03/1971, 44 y.o.   MRN: [id number]  HPI Patients had 2 weeks of maxillary sinus tenderness to palpation, postnasal drip, nasal congestion, and dull fevers. She does have a history of frequent migraines but this feels different. Since starting Topamax however her migraines has stopped and she is very satisfied with his medication. Past Medical History  Diagnosis Date  . Allergy    No past surgical history on file. Current Outpatient Prescriptions on File Prior to Visit  Medication Sig Dispense Refill  . cetirizine (ZYRTEC) 10 MG tablet Take 10 mg by mouth daily.    . Olopatadine HCl 0.2 % SOLN 1 drop each eye once a day for allergies 1 Bottle 3  . topiramate (TOPAMAX) 25 MG tablet Take 2 tablets (50 mg total) by mouth 2 (two) times daily. 120 tablet 5   No current facility-administered medications on file prior to visit.   No Known Allergies History   Social History  . Marital Status: Married    Spouse Name: Legrand Como     Number of Children: 4  . Years of Education: 12+   Occupational History  . Student     Social History Main Topics  . Smoking status: Current Some Day Smoker -- 0.50 packs/day    Types: Cigarettes  . Smokeless tobacco: Never Used  . Alcohol Use: Yes     Comment: rare -ocassionally  . Drug Use: No  . Sexual Activity: Yes   Other Topics Concern  . Not on file   Social History Narrative   Patient lives at home with husband Legrand Como    Patient has 4 children    Patient has 12+ years of education.    Patient is right handed.             Review of Systems  All other systems reviewed and are negative.      Objective:   Physical Exam  Constitutional: She appears well-developed and well-nourished.  HENT:  Right Ear: External ear normal.  Left Ear: External ear normal.  Nose: Mucosal edema and rhinorrhea present. Right sinus exhibits maxillary sinus tenderness. Left sinus exhibits maxillary  sinus tenderness.  Mouth/Throat: Oropharynx is clear and moist. No oropharyngeal exudate.  Cardiovascular: Normal rate and regular rhythm.   Pulmonary/Chest: Effort normal and breath sounds normal.  Vitals reviewed.         Assessment & Plan:  Acute rhinosinusitis - Plan: amoxicillin-clavulanate (AUGMENTIN) 875-125 MG per tablet  I will treat the patient with Augmentin 875 mg by mouth twice a day for 10 days. Also gave her Diflucan in case she develops a yeast infection. I recommended she continue Flonase on a daily basis as a preventative. I would also like her to continue Topamax as a preventative for her migraines. I would like to check the patient annually and physical including fasting lab work. I recommended she schedule this in July.

## 2015-04-02 ENCOUNTER — Encounter: Payer: Self-pay | Admitting: Physician Assistant

## 2015-04-02 ENCOUNTER — Ambulatory Visit (INDEPENDENT_AMBULATORY_CARE_PROVIDER_SITE_OTHER): Admitting: Physician Assistant

## 2015-04-02 VITALS — BP 118/70 | HR 108 | Temp 98.5°F | Resp 18 | Wt 138.0 lb

## 2015-04-02 DIAGNOSIS — J0140 Acute pansinusitis, unspecified: Secondary | ICD-10-CM

## 2015-04-02 MED ORDER — FLUCONAZOLE 150 MG PO TABS: 150.0000 mg | ORAL_TABLET | Freq: Once | ORAL | Status: DC

## 2015-04-02 MED ORDER — OLOPATADINE HCL 0.2 % OP SOLN: OPHTHALMIC | Status: DC

## 2015-04-02 MED ORDER — AMOXICILLIN-POT CLAVULANATE 875-125 MG PO TABS: 1.0000 | ORAL_TABLET | Freq: Two times a day (BID) | ORAL | Status: DC

## 2015-04-02 NOTE — Progress Notes (Signed)
    Patient ID: April Mcmillan MRN: [id number], DOB: 1971/07/15, 44 y.o. Date of Encounter: 04/02/2015, 4:41 PM    Chief Complaint:  Chief Complaint  Patient presents with  . sick x 5 days    sinus infection, using husbands left over augmentin     HPI: 73 y.o. year old female sits with above symptoms.  Says that she was having a lot of pressure and pain and past points to her frontal sinus area as well as maxillary sinus areas bilaterally. Found some left over Augmentin of her husbands in the Which had not expired. Started taking that Friday night and has taken it twice a day since until the last pill this morning. Says that the pressure and pain are improved and she feels much better than starting the antibiotic. Also is getting thick dark mucus from her nose. No chest congestion. No significant sore throat or ear ache.     Home Meds:   Outpatient Prescriptions Prior to Visit  Medication Sig Dispense Refill  . fluticasone (FLONASE) 50 MCG/ACT nasal spray Place 2 sprays into both nostrils as needed. 16 g 5  . topiramate (TOPAMAX) 25 MG tablet Take 2 tablets (50 mg total) by mouth 2 (two) times daily. 120 tablet 5  . Olopatadine HCl 0.2 % SOLN 1 drop each eye once a day for allergies 1 Bottle 3  . cetirizine (ZYRTEC) 10 MG tablet Take 10 mg by mouth daily.    Marland Kitchen amoxicillin-clavulanate (AUGMENTIN) 875-125 MG per tablet Take 1 tablet by mouth 2 (two) times daily. 20 tablet 0  . fluconazole (DIFLUCAN) 150 MG tablet Take 1 tablet (150 mg total) by mouth once. 1 tablet 0   No facility-administered medications prior to visit.    Allergies: No Known Allergies    Review of Systems: See HPI for pertinent ROS. All other ROS negative.    Physical Exam: Blood pressure 118/70, pulse 108, temperature 98.5 F (36.9 C), temperature source Oral, resp. rate 18, weight 138 lb (62.596 kg)., Body mass index is 24.82 kg/(m^2). General:  WNWD WF. Appears in no acute distress. HEENT: Normocephalic,  atraumatic, eyes without discharge, sclera non-icteric, nares are without discharge. Bilateral auditory canals clear, TM's are without perforation, pearly grey and translucent with reflective cone of light bilaterally. Oral cavity moist, posterior pharynx without exudate, erythema, peritonsillar abscess. Mild tenderness with percussion to frontal and maxillary sinuses bilaterally.  Neck: Supple. No thyromegaly. No lymphadenopathy. Lungs: Clear bilaterally to auscultation without wheezes, rales, or rhonchi. Breathing is unlabored. Heart: Regular rhythm. No murmurs, rubs, or gallops. Msk:  Strength and tone normal for age. Extremities/Skin: Warm and dry.  Neuro: Alert and oriented X 3. Moves all extremities spontaneously. Gait is normal. CNII-XII grossly in tact. Psych:  Responds to questions appropriately with a normal affect.     ASSESSMENT AND PLAN:  44 y.o. year old female with  1. Acute pansinusitis, recurrence not specified She requests to have medication in case she gets yeast infection from antibiotic. She Arty has Flonase that she is using for nasal congestion symptoms. Can use over-the-counter decongestants if needed. Follow-up if symptoms do not resolve at completion of antibiotic. - amoxicillin-clavulanate (AUGMENTIN) 875-125 MG per tablet; Take 1 tablet by mouth 2 (two) times daily.  Dispense: 20 tablet; Refill: 0 - fluconazole (DIFLUCAN) 150 MG tablet; Take 1 tablet (150 mg total) by mouth once.  Dispense: 1 tablet; Refill: 0   Signed, [address] [place], [place], [facility] 04/02/2015 4:41 PM

## 2015-06-12 ENCOUNTER — Other Ambulatory Visit: Payer: Self-pay | Admitting: Physician Assistant

## 2015-06-12 NOTE — Telephone Encounter (Signed)
Medication refilled per protocol. 

## 2015-07-25 ENCOUNTER — Ambulatory Visit (INDEPENDENT_AMBULATORY_CARE_PROVIDER_SITE_OTHER): Admitting: Family Medicine

## 2015-07-25 ENCOUNTER — Encounter: Payer: Self-pay | Admitting: Family Medicine

## 2015-07-25 VITALS — BP 120/80 | HR 88 | Temp 98.2°F | Resp 16 | Ht 61.0 in | Wt 131.0 lb

## 2015-07-25 DIAGNOSIS — J302 Other seasonal allergic rhinitis: Secondary | ICD-10-CM

## 2015-07-25 DIAGNOSIS — Z91018 Allergy to other foods: Secondary | ICD-10-CM

## 2015-07-25 DIAGNOSIS — J069 Acute upper respiratory infection, unspecified: Secondary | ICD-10-CM | POA: Diagnosis not present

## 2015-07-25 DIAGNOSIS — R5383 Other fatigue: Secondary | ICD-10-CM | POA: Diagnosis not present

## 2015-07-25 LAB — RAPID STREP SCREEN (MED CTR MEBANE ONLY): Streptococcus, Group A Screen (Direct): NEGATIVE

## 2015-07-25 LAB — CBC WITH DIFFERENTIAL/PLATELET
BASOS ABS: 0.1 10*3/uL (ref 0.0–0.1)
Basophils Relative: 1 % (ref 0–1)
Eosinophils Absolute: 0.1 10*3/uL (ref 0.0–0.7)
Eosinophils Relative: 1 % (ref 0–5)
HCT: 38.9 % (ref 36.0–46.0)
Hemoglobin: 12.6 g/dL (ref 12.0–15.0)
Lymphocytes Relative: 30 % (ref 12–46)
Lymphs Abs: 1.9 10*3/uL (ref 0.7–4.0)
MCH: 29.7 pg (ref 26.0–34.0)
MCHC: 32.4 g/dL (ref 30.0–36.0)
MCV: 91.7 fL (ref 78.0–100.0)
MONO ABS: 0.4 10*3/uL (ref 0.1–1.0)
MONOS PCT: 6 % (ref 3–12)
MPV: 10.6 fL (ref 8.6–12.4)
Neutro Abs: 3.9 10*3/uL (ref 1.7–7.7)
Neutrophils Relative %: 62 % (ref 43–77)
Platelets: 288 10*3/uL (ref 150–400)
RBC: 4.24 MIL/uL (ref 3.87–5.11)
RDW: 12.6 % (ref 11.5–15.5)
WBC: 6.3 10*3/uL (ref 4.0–10.5)

## 2015-07-25 MED ORDER — AZITHROMYCIN 250 MG PO TABS: ORAL_TABLET | ORAL | Status: DC

## 2015-07-25 MED ORDER — TOPIRAMATE 25 MG PO TABS: 25.0000 mg | ORAL_TABLET | Freq: Every day | ORAL | Status: DC

## 2015-07-25 NOTE — Patient Instructions (Addendum)
Steroid Shot Zpak given if not improved with steroid shot  Allergy referral   Use salt water gargle  Use Delsym for cough  F/U as needed

## 2015-07-25 NOTE — Progress Notes (Signed)
Patient ID: April Mcmillan, female   DOB: Oct 11, 1971, 44 y.o.   MRN: [id number]   Subjective:    Patient ID: April Mcmillan, female    DOB: Dec 23, 1970, 44 y.o.   MRN: [id number]  Patient presents for Illness  patient here states that she has had cough nonproductive sore throat feels like her lymph nodes are swollen for the past 2 weeks. She thinks she may have had a sinus infection back in July she was given amoxicillin but things didn't seem to improve. She has severe underlying allergies was on allergy shots in the past she's also had chronic sinusitis problems. She was seen by ear nose and throat for her deviated septum and sinus issues but nothing could be done and they recommended she go see an allergist. She is taking Zyrtec and Flonase and she thinks that this is the best over-the-counter combination that she has been on. She has not had any fever no chills.  At the end of the visit she asked if her labs  could be drawn because she knows that her hair was thinning and she gets tired on and off.    Review Of Systems:  GEN- + fatigue, denies fever, weight loss,weakness, recent illness HEENT- denies eye drainage, change in vision, nasal discharge, CVS- denies chest pain, palpitations RESP- denies SOB, +cough, wheeze ABD- denies N/V, change in stools, abd pain GU- denies dysuria, hematuria, dribbling, incontinence MSK- denies joint pain, muscle aches, injury Neuro- denies headache, dizziness, syncope, seizure activity       Objective:    BP 120/80 mmHg  Pulse 88  Temp(Src) 98.2 F (36.8 C) (Oral)  Resp 16  Ht 5\' 1"  (1.549 m)  Wt 131 lb (59.421 kg)  BMI 24.76 kg/m2  LMP 07/18/2015 (Approximate) GEN- NAD, alert and oriented x3 HEENT- PERRL, EOMI, non injected sclera, pink conjunctiva, MMM, oropharynx clear, mild ttp bilat maxillary sinuses, no frontal sinus tenderness. Allergic shiners  Neck- Supple, no thyromegaly CVS- RRR, no murmur RESP-CTAB EXT- No edema Pulses- Radial   2+        Assessment & Plan:      Problem List Items Addressed This Visit    Seasonal allergies    Severe allergies, pt also wants to be tested for food allergies feels she is allergic to every thing      Relevant Orders   Ambulatory referral to Allergy    Other Visit Diagnoses    Acute URI    -  Primary    possible URI with underlying allergies, given Depo Medrol see if she responds to this then this is more allergies, if not improved start zpak.    Relevant Medications    azithromycin (ZITHROMAX) 250 MG tablet    Other Relevant Orders    Rapid Strep Screen (Completed)    Other fatigue        Relevant Orders    CBC with Differential/Platelet    Comprehensive metabolic panel    TSH    Multiple food allergies        Relevant Orders    Ambulatory referral to Allergy       Note: This dictation was prepared with Dragon dictation along with smaller phrase technology. Any transcriptional errors that result from this process are unintentional.

## 2015-07-25 NOTE — Assessment & Plan Note (Signed)
Severe allergies, pt also wants to be tested for food allergies feels she is allergic to every thing

## 2015-07-26 LAB — COMPREHENSIVE METABOLIC PANEL
ALT: 13 U/L (ref 6–29)
AST: 16 U/L (ref 10–35)
Albumin: 4.5 g/dL (ref 3.6–5.1)
Alkaline Phosphatase: 48 U/L (ref 33–115)
BUN: 8 mg/dL (ref 7–25)
CALCIUM: 9.6 mg/dL (ref 8.6–10.2)
CHLORIDE: 107 mmol/L (ref 98–110)
CO2: 23 mmol/L (ref 20–31)
Creat: 0.74 mg/dL (ref 0.50–1.10)
GLUCOSE: 78 mg/dL (ref 70–99)
POTASSIUM: 4.1 mmol/L (ref 3.5–5.3)
Sodium: 139 mmol/L (ref 135–146)
Total Bilirubin: 0.4 mg/dL (ref 0.2–1.2)
Total Protein: 6.9 g/dL (ref 6.1–8.1)

## 2015-07-26 LAB — TSH: TSH: 0.888 u[IU]/mL (ref 0.350–4.500)

## 2015-09-24 ENCOUNTER — Ambulatory Visit (INDEPENDENT_AMBULATORY_CARE_PROVIDER_SITE_OTHER): Admitting: Family Medicine

## 2015-09-24 ENCOUNTER — Encounter: Payer: Self-pay | Admitting: Family Medicine

## 2015-09-24 VITALS — BP 118/64 | HR 72 | Temp 98.2°F | Resp 16 | Ht 61.0 in | Wt 131.0 lb

## 2015-09-24 DIAGNOSIS — N938 Other specified abnormal uterine and vaginal bleeding: Secondary | ICD-10-CM | POA: Diagnosis not present

## 2015-09-24 DIAGNOSIS — J209 Acute bronchitis, unspecified: Secondary | ICD-10-CM | POA: Diagnosis not present

## 2015-09-24 MED ORDER — AMOXICILLIN-POT CLAVULANATE 875-125 MG PO TABS: 1.0000 | ORAL_TABLET | Freq: Two times a day (BID) | ORAL | Status: DC

## 2015-09-24 MED ORDER — METHYLPREDNISOLONE ACETATE 40 MG/ML IJ SUSP
40.0000 mg | Freq: Once | INTRAMUSCULAR | Status: AC
  Administered 2015-09-24: 40 mg via INTRAMUSCULAR

## 2015-09-24 NOTE — Patient Instructions (Signed)
Take Augmentin twice a day  Depo Medrol Mucinex DM   Referral to GYN  F/U as needed

## 2015-09-24 NOTE — Progress Notes (Signed)
Patient ID: April Mcmillan, female   DOB: 02/26/1971, 44 y.o.   MRN: [id number]   Subjective:    Patient ID: April Mcmillan, female    DOB: 09/08/1971, 44 y.o.   MRN: [id number]  Patient presents for Illness  Pt here with cough with congestion,. Sinus pressure drainage like typica sinuitis problems, no fever, using allergy meds and mucinex for cough. +smoker.   No GI symptoms, denies sore throat, had ZPak at home but concerned about side effects therefore did not take after last visit, Depo Medrol actually cleared up her symptoms  Past few months has had cycles close together which are very heavy, has been on menses for past 25 days, some days lighter than others, no severe cramping, currently more spotting, no vaginal discharge. Due for GYN exam     Review Of Systems:  GEN- denies fatigue, fever, weight loss,weakness, recent illness HEENT- denies eye drainage, change in vision, +nasal discharge, CVS- denies chest pain, palpitations RESP- denies SOB, +cough, wheeze ABD- denies N/V, change in stools, abd pain Neuro- denies headache, dizziness, syncope, seizure activity       Objective:    BP 118/64 mmHg  Pulse 72  Temp(Src) 98.2 F (36.8 C) (Oral)  Resp 16  Ht 5\' 1"  (1.549 m)  Wt 131 lb (59.421 kg)  BMI 24.76 kg/m2 GEN- NAD, alert and oriented x3 HEENT- PERRL, EOMI, non injected sclera, pink conjunctiva, MMM, oropharynx clear , TM clear bilat no effusion,  + maxillary sinus tenderness, inflammed turbinates,  Nasal drainage  Neck- Supple, no LAD CVS- RRR, no murmur RESP-harsh cough, Clear, no rales, no rhonchi, upper airway congestion EXT- No edema Pulses- Radial 2+         Assessment & Plan:      Problem List Items Addressed This Visit    None      Note: This dictation was prepared with Dragon dictation along with smaller phrase technology. Any transcriptional errors that result from this process are unintentional.

## 2015-10-08 ENCOUNTER — Encounter: Payer: Self-pay | Admitting: Gynecology

## 2015-10-08 ENCOUNTER — Ambulatory Visit (INDEPENDENT_AMBULATORY_CARE_PROVIDER_SITE_OTHER): Admitting: Gynecology

## 2015-10-08 ENCOUNTER — Other Ambulatory Visit (HOSPITAL_COMMUNITY)
Admission: RE | Admit: 2015-10-08 | Discharge: 2015-10-08 | Disposition: A | Discharge status: Home / Self Care | Source: Ambulatory Visit | Attending: Gynecology | Admitting: Gynecology

## 2015-10-08 VITALS — BP 116/74 | Ht 62.0 in | Wt 131.0 lb

## 2015-10-08 DIAGNOSIS — N920 Excessive and frequent menstruation with regular cycle: Secondary | ICD-10-CM | POA: Diagnosis not present

## 2015-10-08 DIAGNOSIS — Z01419 Encounter for gynecological examination (general) (routine) without abnormal findings: Secondary | ICD-10-CM | POA: Diagnosis not present

## 2015-10-08 DIAGNOSIS — Z1151 Encounter for screening for human papillomavirus (HPV): Secondary | ICD-10-CM | POA: Insufficient documentation

## 2015-10-08 DIAGNOSIS — N946 Dysmenorrhea, unspecified: Secondary | ICD-10-CM

## 2015-10-08 NOTE — Patient Instructions (Signed)
Follow up for ultrasound as scheduled.  Call to Schedule your mammogram  Facilities in [employer]: 1)  [facility], [place] Phone: [phone] 2)  [facility]. [address] [place] Phone: [phone] 3)  Dr. Isaiah Blakes at [facility] [address] Phone: [phone]     Mammogram A mammogram is an X-ray test to find changes in a woman's breast. You should get a mammogram if:  You are 28 years of age or older  You have risk factors.   Your doctor recommends that you have one.  BEFORE THE TEST  Do not schedule the test the week before your period, especially if your breasts are sore during this time.  On the day of your mammogram:  Wash your breasts and armpits well. After washing, do not put on any deodorant or talcum powder on until after your test.   Eat and drink as you usually do.   Take your medicines as usual.   If you are diabetic and take insulin, make sure you:   Eat before coming for your test.   Take your insulin as usual.   If you cannot keep your appointment, call before the appointment to cancel. Schedule another appointment.  TEST  You will need to undress from the waist up. You will put on a hospital gown.   Your breast will be put on the mammogram machine, and it will press firmly on your breast with a piece of plastic called a compression paddle. This will make your breast flatter so that the machine can X-ray all parts of your breast.   Both breasts will be X-rayed. Each breast will be X-rayed from above and from the side. An X-ray might need to be taken again if the picture is not good enough.   The mammogram will last about 15 to 30 minutes.  AFTER THE TEST Finding out the results of your test Ask when your test results will be ready. Make sure you get your test results.  Document Released: 02/20/2009 Document Revised: 11/13/2011 Document  Reviewed: 02/20/2009 [facility] Patient Information 2012 [place].  You may obtain a copy of any labs that were done today by logging onto MyChart as outlined in the instructions provided with your AVS (after visit summary). The office will not call with normal lab results but certainly if there are any significant abnormalities then we will contact you.   Health Maintenance Adopting a healthy lifestyle and getting preventive care can go a long way to promote health and wellness. Talk with your health care provider about what schedule of regular examinations is right for you. This is a good chance for you to check in with your provider about disease prevention and staying healthy. In between checkups, there are plenty of things you can do on your own. Experts have done a lot of research about which lifestyle changes and preventive measures are most likely to keep you healthy. Ask your health care provider for more information. WEIGHT AND DIET  Eat a healthy diet  Be sure to include plenty of vegetables, fruits, low-fat dairy products, and lean protein.  Do not eat a lot of foods high in solid fats, added sugars, or salt.  Get regular exercise. This is one of the most important things you can do for your health.  Most adults should exercise for at least 150 minutes each week. The exercise should increase your heart  rate and make you sweat (moderate-intensity exercise).  Most adults should also do strengthening exercises at least twice a week. This is in addition to the moderate-intensity exercise.  Maintain a healthy weight  Body mass index (BMI) is a measurement that can be used to identify possible weight problems. It estimates body fat based on height and weight. Your health care provider can help determine your BMI and help you achieve or maintain a healthy weight.  For females 43 years of age and older:   A BMI below 18.5 is considered underweight.  A BMI of 18.5 to 24.9 is  normal.  A BMI of 25 to 29.9 is considered overweight.  A BMI of 30 and above is considered obese.  Watch levels of cholesterol and blood lipids  You should start having your blood tested for lipids and cholesterol at 46 years of age, then have this test every 5 years.  You may need to have your cholesterol levels checked more often if:  Your lipid or cholesterol levels are high.  You are older than 46 years of age.  You are at high risk for heart disease.  CANCER SCREENING   Lung Cancer  Lung cancer screening is recommended for adults 48-63 years old who are at high risk for lung cancer because of a history of smoking.  A yearly low-dose CT scan of the lungs is recommended for people who:  Currently smoke.  Have quit within the past 15 years.  Have at least a 30-pack-year history of smoking. A pack year is smoking an average of one pack of cigarettes a day for 1 year.  Yearly screening should continue until it has been 15 years since you quit.  Yearly screening should stop if you develop a health problem that would prevent you from having lung cancer treatment.  Breast Cancer  Practice breast self-awareness. This means understanding how your breasts normally appear and feel.  It also means doing regular breast self-exams. Let your health care provider know about any changes, no matter how small.  If you are in your 20s or 30s, you should have a clinical breast exam (CBE) by a health care provider every 1-3 years as part of a regular health exam.  If you are 77 or older, have a CBE every year. Also consider having a breast X-ray (mammogram) every year.  If you have a family history of breast cancer, talk to your health care provider about genetic screening.  If you are at high risk for breast cancer, talk to your health care provider about having an MRI and a mammogram every year.  Breast cancer gene (BRCA) assessment is recommended for women who have family members  with BRCA-related cancers. BRCA-related cancers include:  Breast.  Ovarian.  Tubal.  Peritoneal cancers.  Results of the assessment will determine the need for genetic counseling and BRCA1 and BRCA2 testing. Cervical Cancer Routine pelvic examinations to screen for cervical cancer are no longer recommended for nonpregnant women who are considered low risk for cancer of the pelvic organs (ovaries, uterus, and vagina) and who do not have symptoms. A pelvic examination may be necessary if you have symptoms including those associated with pelvic infections. Ask your health care provider if a screening pelvic exam is right for you.   The Pap test is the screening test for cervical cancer for women who are considered at risk.  If you had a hysterectomy for a problem that was not cancer or a condition that could  lead to cancer, then you no longer need Pap tests.  If you are older than 65 years, and you have had normal Pap tests for the past 10 years, you no longer need to have Pap tests.  If you have had past treatment for cervical cancer or a condition that could lead to cancer, you need Pap tests and screening for cancer for at least 20 years after your treatment.  If you no longer get a Pap test, assess your risk factors if they change (such as having a new sexual partner). This can affect whether you should start being screened again.  Some women have medical problems that increase their chance of getting cervical cancer. If this is the case for you, your health care provider may recommend more frequent screening and Pap tests.  The human papillomavirus (HPV) test is another test that may be used for cervical cancer screening. The HPV test looks for the virus that can cause cell changes in the cervix. The cells collected during the Pap test can be tested for HPV.  The HPV test can be used to screen women 56 years of age and older. Getting tested for HPV can extend the interval between normal  Pap tests from three to five years.  An HPV test also should be used to screen women of any age who have unclear Pap test results.  After 46 years of age, women should have HPV testing as often as Pap tests.  Colorectal Cancer  This type of cancer can be detected and often prevented.  Routine colorectal cancer screening usually begins at 46 years of age and continues through 46 years of age.  Your health care provider may recommend screening at an earlier age if you have risk factors for colon cancer.  Your health care provider may also recommend using home test kits to check for hidden blood in the stool.  A small camera at the end of a tube can be used to examine your colon directly (sigmoidoscopy or colonoscopy). This is done to check for the earliest forms of colorectal cancer.  Routine screening usually begins at age 15.  Direct examination of the colon should be repeated every 5-10 years through 46 years of age. However, you may need to be screened more often if early forms of precancerous polyps or small growths are found. Skin Cancer  Check your skin from head to toe regularly.  Tell your health care provider about any new moles or changes in moles, especially if there is a change in a mole's shape or color.  Also tell your health care provider if you have a mole that is larger than the size of a pencil eraser.  Always use sunscreen. Apply sunscreen liberally and repeatedly throughout the day.  Protect yourself by wearing long sleeves, pants, a wide-brimmed hat, and sunglasses whenever you are outside. HEART DISEASE, DIABETES, AND HIGH BLOOD PRESSURE   Have your blood pressure checked at least every 1-2 years. High blood pressure causes heart disease and increases the risk of stroke.  If you are between 56 years and 53 years old, ask your health care provider if you should take aspirin to prevent strokes.  Have regular diabetes screenings. This involves taking a blood  sample to check your fasting blood sugar level.  If you are at a normal weight and have a low risk for diabetes, have this test once every three years after 46 years of age.  If you are overweight and have a  high risk for diabetes, consider being tested at a younger age or more often. PREVENTING INFECTION  Hepatitis B  If you have a higher risk for hepatitis B, you should be screened for this virus. You are considered at high risk for hepatitis B if:  You were born in a country where hepatitis B is common. Ask your health care provider which countries are considered high risk.  Your parents were born in a high-risk country, and you have not been immunized against hepatitis B (hepatitis B vaccine).  You have HIV or AIDS.  You use needles to inject street drugs.  You live with someone who has hepatitis B.  You have had sex with someone who has hepatitis B.  You get hemodialysis treatment.  You take certain medicines for conditions, including cancer, organ transplantation, and autoimmune conditions. Hepatitis C  Blood testing is recommended for:  Everyone born from [birth year] through 1967.  Anyone with known risk factors for hepatitis C. Sexually transmitted infections (STIs)  You should be screened for sexually transmitted infections (STIs) including gonorrhea and chlamydia if:  You are sexually active and are younger than 46 years of age.  You are older than 46 years of age and your health care provider tells you that you are at risk for this type of infection.  Your sexual activity has changed since you were last screened and you are at an increased risk for chlamydia or gonorrhea. Ask your health care provider if you are at risk.  If you do not have HIV, but are at risk, it may be recommended that you take a prescription medicine daily to prevent HIV infection. This is called pre-exposure prophylaxis (PrEP). You are considered at risk if:  You are sexually active and do not  regularly use condoms or know the HIV status of your partner(s).  You take drugs by injection.  You are sexually active with a partner who has HIV. Talk with your health care provider about whether you are at high risk of being infected with HIV. If you choose to begin PrEP, you should first be tested for HIV. You should then be tested every 3 months for as long as you are taking PrEP.  PREGNANCY   If you are premenopausal and you may become pregnant, ask your health care provider about preconception counseling.  If you may become pregnant, take 400 to 800 micrograms (mcg) of folic acid every day.  If you want to prevent pregnancy, talk to your health care provider about birth control (contraception). OSTEOPOROSIS AND MENOPAUSE   Osteoporosis is a disease in which the bones lose minerals and strength with aging. This can result in serious bone fractures. Your risk for osteoporosis can be identified using a bone density scan.  If you are 78 years of age or older, or if you are at risk for osteoporosis and fractures, ask your health care provider if you should be screened.  Ask your health care provider whether you should take a calcium or vitamin D supplement to lower your risk for osteoporosis.  Menopause may have certain physical symptoms and risks.  Hormone replacement therapy may reduce some of these symptoms and risks. Talk to your health care provider about whether hormone replacement therapy is right for you.  HOME CARE INSTRUCTIONS   Schedule regular health, dental, and eye exams.  Stay current with your immunizations.   Do not use any tobacco products including cigarettes, chewing tobacco, or electronic cigarettes.  If you are pregnant, do  not drink alcohol.  If you are breastfeeding, limit how much and how often you drink alcohol.  Limit alcohol intake to no more than 1 drink per day for nonpregnant women. One drink equals 12 ounces of beer, 5 ounces of wine, or 1  ounces of hard liquor.  Do not use street drugs.  Do not share needles.  Ask your health care provider for help if you need support or information about quitting drugs.  Tell your health care provider if you often feel depressed.  Tell your health care provider if you have ever been abused or do not feel safe at home. Document Released: 06/09/2011 Document Revised: 04/10/2014 Document Reviewed: 10/26/2013 [facility] Patient Information 2015 [place], [place]. This information is not intended to replace advice given to you by your health care provider. Make sure you discuss any questions you have with your health care provider.

## 2015-10-08 NOTE — Addendum Note (Signed)
Addended by: Nelva Nay on: 10/08/2015 03:01 PM   Modules accepted: Orders

## 2015-10-08 NOTE — Progress Notes (Signed)
April Mcmillan 1971/05/02 [id number]        44 y.o.  G3P3 new patient who has not seen a gynecologist for a number of years. Has fairly heavy regular monthly menses with dysmenorrhea. Has to change her pads frequently and is relatively housebound for the first several days. Started her last menstrual period. End of September on time and that bled on and off since then up until several days ago when she has stopped bleeding. She does have a primary physician who sees her for routine health care and he had recommended that she follow up with a gynecologist as it has been several years since her last exam. She has no history of being told that she had leiomyoma or other pathology. No history of abnormal Pap smears previously.  Past medical history,surgical history, problem list, medications, allergies, family history and social history were all reviewed and documented as reviewed in the EPIC chart.  ROS:  Performed with pertinent positives and negatives included in the history, assessment and plan.   Additional significant findings :  none   Exam: Kim Counsellor Vitals:   10/08/15 1356  BP: 116/74  Height: 5\' 2"  (1.575 m)  Weight: 131 lb (59.421 kg)   General appearance:  Normal affect, orientation and appearance. Skin: Grossly normal HEENT: Without gross lesions.  No cervical or supraclavicular adenopathy. Thyroid normal.  Lungs:  Clear without wheezing, rales or rhonchi Cardiac: RR, without RMG Abdominal:  Soft, nontender, without masses, guarding, rebound, organomegaly or hernia Breasts:  Examined lying and sitting without masses, retractions, discharge or axillary adenopathy. Pelvic:  Ext/BUS/vagina normal  Cervix normal. Pap smear/HPV  Uterus anteverted, normal size, shape and contour, midline and mobile nontender   Adnexa  Without masses or tenderness    Anus and perineum  Normal   Rectovaginal  Normal sphincter tone without palpated masses or tenderness.    Assessment/Plan:  44  y.o. G3P3 female for annual exam with regular menses, tubal sterilization.   1. Menorrhagia/dysmenorrhea. Patient's a relatively heavy keeping her in the house for the first several days. Also has associated cramping. Have been regular monthly until this last one where she bled on and off for the last month which has ultimately now stopped. Exam overall was normal. Recommend starting with a sonohysterogram rule out intracavitary abnormalities and nonpalpable abnormalities. Various scenarios and possibilities were reviewed both from a diagnostic standpoint such as polyps/leiomyoma/hormonal dysfunction. Various treatment options to include hormonal manipulation, Mirena IUD, endometrial ablation, hysterectomy were discussed with the patient. Recent TSH normal. Recent hemoglobin 12.6. Patient will follow up with the ultrasound and we'll go from there. 2. Pap smear many years ago. Pap smear/HPV today. No history of abnormal Pap smears previously. 3. Mammography 2011. Strongly recommended patient schedule mammography and names and numbers provided.  SBE monthly reviewed. 4. Health maintenance. No routine blood work drawn as patient reports this done through her primary physician's office. I did discuss her smoking with her.  Follow up for sonohysterogram.   Anastasio Auerbach MD, 2:35 PM 10/08/2015

## 2015-10-11 ENCOUNTER — Telehealth: Payer: Self-pay | Admitting: Gynecology

## 2015-10-11 ENCOUNTER — Other Ambulatory Visit: Payer: Self-pay | Admitting: Gynecology

## 2015-10-11 DIAGNOSIS — N939 Abnormal uterine and vaginal bleeding, unspecified: Secondary | ICD-10-CM

## 2015-10-11 LAB — CYTOLOGY - PAP

## 2015-10-11 NOTE — Telephone Encounter (Signed)
10/11/15-I advised pt today that her [employer] ins has a 25% cost share of the allowable for the procedure that the patient is responsible for. She will owe $237.81 day of test. If a bx is needed, an additional $61.13 will be billed. Pt wants to proceed.wl

## 2015-10-17 ENCOUNTER — Ambulatory Visit (INDEPENDENT_AMBULATORY_CARE_PROVIDER_SITE_OTHER)

## 2015-10-17 ENCOUNTER — Ambulatory Visit (INDEPENDENT_AMBULATORY_CARE_PROVIDER_SITE_OTHER): Admitting: Gynecology

## 2015-10-17 ENCOUNTER — Other Ambulatory Visit: Payer: Self-pay | Admitting: Gynecology

## 2015-10-17 ENCOUNTER — Encounter: Payer: Self-pay | Admitting: Gynecology

## 2015-10-17 VITALS — BP 114/70

## 2015-10-17 DIAGNOSIS — N831 Corpus luteum cyst of ovary, unspecified side: Secondary | ICD-10-CM | POA: Diagnosis not present

## 2015-10-17 DIAGNOSIS — N920 Excessive and frequent menstruation with regular cycle: Secondary | ICD-10-CM | POA: Diagnosis not present

## 2015-10-17 DIAGNOSIS — D251 Intramural leiomyoma of uterus: Secondary | ICD-10-CM | POA: Diagnosis not present

## 2015-10-17 DIAGNOSIS — N939 Abnormal uterine and vaginal bleeding, unspecified: Secondary | ICD-10-CM

## 2015-10-17 DIAGNOSIS — N83202 Unspecified ovarian cyst, left side: Secondary | ICD-10-CM | POA: Diagnosis not present

## 2015-10-17 DIAGNOSIS — N946 Dysmenorrhea, unspecified: Secondary | ICD-10-CM | POA: Diagnosis not present

## 2015-10-17 NOTE — Progress Notes (Signed)
April Mcmillan 10/26/1971 [id number]        44 y.o.  G3P3 Presents for sent histogram due to history of menorrhagia and most recently irregular bleeding.  Recent TSH and hemoglobin normal.  Past medical history,surgical history, problem list, medications, allergies, family history and social history were all reviewed and documented in the EPIC chart.  Directed ROS with pertinent positives and negatives documented in the history of present illness/assessment and plan.  Exam: [place] assistant Filed Vitals:   10/17/15 1550  BP: 114/70   General appearance:  Normal External BUS vagina normal. Cervix normal. Uterus normal size midline mobile nontender. Adnexa without masses or tenderness  Ultrasound shows uterus grossly normal with small 18 mm myoma. Endometrial echo 11.5 mm. Right and left ovaries with physiologic cystic changes. Cul-de-sac negative.  Sonohysterogram performed, sterile technique, easy catheter introduction, good distention with no abnormalities. Endometrial sample taken.  Assessment/Plan:  44 y.o. G3P3 with heavy menses times several days each month. Most recently has some irregular bleeding but this is resolved. Options for management reviewed to include expectant, hormonal manipulation, Mirena IUD, endometrial ablation, hysterectomy. The pros/cons of each choice discussed. Literature for endometrial ablation given her request. Patient prefers at this point just to monitor but will call if she decides to pursue a treatment option. Patient will follow up for biopsy results in several days.    Anastasio Auerbach MD, 4:14 PM 10/17/2015

## 2015-10-17 NOTE — Patient Instructions (Signed)
Office will call you with biopsy results 

## 2015-12-14 ENCOUNTER — Encounter: Payer: Self-pay | Admitting: Family Medicine

## 2015-12-14 ENCOUNTER — Ambulatory Visit (INDEPENDENT_AMBULATORY_CARE_PROVIDER_SITE_OTHER): Admitting: Family Medicine

## 2015-12-14 VITALS — BP 118/68 | HR 72 | Temp 97.9°F | Resp 14 | Ht 62.0 in | Wt 131.0 lb

## 2015-12-14 DIAGNOSIS — H6001 Abscess of right external ear: Secondary | ICD-10-CM

## 2015-12-14 MED ORDER — NEOMYCIN-POLYMYXIN-HC 3.5-10000-1 OT SOLN: 3.0000 [drp] | Freq: Four times a day (QID) | OTIC | Status: DC

## 2015-12-14 MED ORDER — AMOXICILLIN-POT CLAVULANATE 875-125 MG PO TABS: 1.0000 | ORAL_TABLET | Freq: Two times a day (BID) | ORAL | Status: DC

## 2015-12-14 MED ORDER — ALBUTEROL SULFATE HFA 108 (90 BASE) MCG/ACT IN AERS: 2.0000 | INHALATION_SPRAY | RESPIRATORY_TRACT | Status: DC | PRN

## 2015-12-14 NOTE — Progress Notes (Signed)
Patient ID: April Mcmillan, female   DOB: 11-25-1971, 45 y.o.   MRN: [id number]   Subjective:    Patient ID: April Mcmillan, female    DOB: 24-Jan-1971, 45 y.o.   MRN: [id number]  Patient presents for Knot to L Ear  patient here with a knot to her left ear canal. She states that she was using her nasal spray felt like she may have had some fluid in her ear she does remember getting water in her ear and taking into her ear with a Q-tip she also may have scratched it with her nail. The past couple days she noticed that she cannot hear as well that she had some pain and redness. He states when her husband looked and it she saw something swollen in the canal. She has not had any fever no sinus drainage but has had a little sinus headache recently.  Needs refill on Proair    Review Of Systems:  GEN- denies fatigue, fever, weight loss,weakness, recent illness HEENT- denies eye drainage, change in vision, nasal discharge, CVS- denies chest pain, palpitations RESP- denies SOB, cough, wheeze ABD- denies N/V, change in stools, abd pain Neuro- denies headache, dizziness, syncope, seizure activity       Objective:    BP 118/68 mmHg  Pulse 72  Temp(Src) 97.9 F (36.6 C) (Oral)  Resp 14  Ht 5\' 2"  (1.575 m)  Wt 131 lb (59.421 kg)  BMI 23.95 kg/m2  LMP 11/14/2015 (Approximate) GEN- NAD, alert and oriented x3 HEENT- PERRL, EOMI, non injected sclera, pink conjunctiva, MMM, oropharynx clear, TM clear bilat no effusion, Left canal erythema swelling with scab on top- apperas fluctuant but unable to palpate, TTP post auricular,  No maxillary sinus tenderness, nares clear  Neck- Supple, + post aurical and shotty anterior neck LAD  CVS- RRR, no murmur RESP-CTAB           Assessment & Plan:      Problem List Items Addressed This Visit    None    Visit Diagnoses    Abscess, ear canal, right    -  Primary    Concern for ascess based on apperance, likley from placing foreign body in ear, given Otic  drops and oral augmentin, if no improvement needs ENT referral, no fever, hearing in tact       Note: This dictation was prepared with Dragon dictation along with smaller phrase technology. Any transcriptional errors that result from this process are unintentional.

## 2015-12-14 NOTE — Patient Instructions (Signed)
Take antibiotics as prescribed Use drop as prescribed  Call if not improved and ENT referral will be done  F/U as needed

## 2016-03-21 ENCOUNTER — Other Ambulatory Visit: Payer: Self-pay | Admitting: Family Medicine

## 2016-03-21 NOTE — Telephone Encounter (Signed)
Refill appropriate and filled per protocol. 

## 2016-07-17 ENCOUNTER — Other Ambulatory Visit: Payer: Self-pay | Admitting: Family Medicine

## 2016-07-17 NOTE — Telephone Encounter (Signed)
Refill appropriate and filled per protocol. 

## 2016-08-12 ENCOUNTER — Ambulatory Visit (INDEPENDENT_AMBULATORY_CARE_PROVIDER_SITE_OTHER): Admitting: Family Medicine

## 2016-08-12 ENCOUNTER — Encounter: Payer: Self-pay | Admitting: Family Medicine

## 2016-08-12 VITALS — BP 144/92 | HR 100 | Temp 97.9°F | Resp 16 | Ht 61.0 in | Wt 132.0 lb

## 2016-08-12 DIAGNOSIS — R5382 Chronic fatigue, unspecified: Secondary | ICD-10-CM

## 2016-08-12 DIAGNOSIS — M2550 Pain in unspecified joint: Secondary | ICD-10-CM

## 2016-08-12 DIAGNOSIS — G43101 Migraine with aura, not intractable, with status migrainosus: Secondary | ICD-10-CM

## 2016-08-12 LAB — COMPLETE METABOLIC PANEL WITH GFR
ALT: 10 U/L (ref 6–29)
AST: 13 U/L (ref 10–35)
Albumin: 4.2 g/dL (ref 3.6–5.1)
Alkaline Phosphatase: 47 U/L (ref 33–115)
BUN: 8 mg/dL (ref 7–25)
CHLORIDE: 107 mmol/L (ref 98–110)
CO2: 19 mmol/L — AB (ref 20–31)
Calcium: 9 mg/dL (ref 8.6–10.2)
Creat: 0.86 mg/dL (ref 0.50–1.10)
GFR, EST NON AFRICAN AMERICAN: 81 mL/min (ref >=60)
Glucose, Bld: 94 mg/dL (ref 70–99)
Potassium: 4 mmol/L (ref 3.5–5.3)
Sodium: 139 mmol/L (ref 135–146)
Total Bilirubin: 0.3 mg/dL (ref 0.2–1.2)
Total Protein: 6.8 g/dL (ref 6.1–8.1)

## 2016-08-12 LAB — CBC WITH DIFFERENTIAL/PLATELET
BASOS ABS: 0 {cells}/uL (ref 0–200)
BASOS PCT: 0 %
EOS ABS: 0 {cells}/uL — AB (ref 15–500)
Eosinophils Relative: 0 %
HCT: 38.1 % (ref 35.0–45.0)
Hemoglobin: 12.3 g/dL (ref 12.0–15.0)
LYMPHS PCT: 22 %
Lymphs Abs: 1584 cells/uL (ref 850–3900)
MCH: 29.5 pg (ref 27.0–33.0)
MCHC: 32.3 g/dL (ref 32.0–36.0)
MCV: 91.4 fL (ref 80.0–100.0)
MONOS PCT: 7 %
MPV: 10.8 fL (ref 7.5–12.5)
Monocytes Absolute: 504 cells/uL (ref 200–950)
Neutro Abs: 5112 cells/uL (ref 1500–7800)
Neutrophils Relative %: 71 %
PLATELETS: 251 10*3/uL (ref 140–400)
RBC: 4.17 MIL/uL (ref 3.80–5.10)
RDW: 13.3 % (ref 11.0–15.0)
WBC: 7.2 10*3/uL (ref 3.8–10.8)

## 2016-08-12 LAB — RHEUMATOID FACTOR

## 2016-08-12 LAB — TSH: TSH: 0.86 mIU/L

## 2016-08-12 NOTE — Progress Notes (Signed)
Subjective:    Patient ID: April Mcmillan, female    DOB: Oct 02, 1971, 45 y.o.   MRN: [id number]  HPI Patient is on Topamax 25 mg by mouth daily at bedtime for migraine prophylaxis. This is working extremely well in this regard. However she would like to discontinue the medication because she feels that it is contributing to fatigue and trouble concentrating. She also reports diffuse arthralgias. This included her neck, her shoulder, her hips, and her knees. In fact she has tenderness and 11 out of 18 tender points consistent with fibromyalgia. She is concerned that she may have rheumatoid arthritis as both her maternal grandfather, her aunt, and her niece all have this condition. She too is also having pain in stiffness in the joints in her hands and wrist bilaterally. She also complains of daily fatigue. She denies any weight loss or fevers. She also reports polyuria. She does report some mild blurry vision. Past Medical History:  Diagnosis Date  . Allergy    Past Surgical History:  Procedure Laterality Date  . TONSILLECTOMY    . TUBAL LIGATION     Current Outpatient Prescriptions on File Prior to Visit  Medication Sig Dispense Refill  . albuterol (PROVENTIL HFA;VENTOLIN HFA) 108 (90 Base) MCG/ACT inhaler Inhale 2 puffs into the lungs every 4 (four) hours as needed for wheezing or shortness of breath. 1 Inhaler 1  . amoxicillin-clavulanate (AUGMENTIN) 875-125 MG tablet Take 1 tablet by mouth 2 (two) times daily. 20 tablet 0  . cetirizine (ZYRTEC) 10 MG tablet Take 10 mg by mouth daily.    . fluticasone (FLONASE) 50 MCG/ACT nasal spray USE 2 SPRAYS IN EACH NOSTRIL AS NEEDED 16 g 3  . neomycin-polymyxin-hydrocortisone (CORTISPORIN) otic solution Place 3 drops into the left ear 4 (four) times daily. FOR 5 DAYS 10 mL 0  . PATADAY 0.2 % SOLN USE 1 DROP IN EACH EYE ONCE DAILY FOR ALLERGIES 2.5 mL 0  . topiramate (TOPAMAX) 25 MG tablet TAKE 2 TABLETS BY MOUTH TWICE DAILY 120 tablet 0   No current  facility-administered medications on file prior to visit.    No Known Allergies Social History   Social History  . Marital status: Married    Spouse name: Legrand Como   . Number of children: 4  . Years of education: 12+   Occupational History  . Student     Social History Main Topics  . Smoking status: Current Some Day Smoker    Packs/day: 0.50    Types: Cigarettes  . Smokeless tobacco: Never Used  . Alcohol use 0.0 oz/week     Comment: rare -ocassionally  . Drug use: No  . Sexual activity: Yes    Birth control/ protection: Surgical     Comment: 1st intercourse 45 yo-More than 5 partners-BTL   Other Topics Concern  . Not on file   Social History Narrative   Patient lives at home with husband Legrand Como    Patient has 4 children    Patient has 12+ years of education.    Patient is right handed.             Review of Systems  All other systems reviewed and are negative.      Objective:   Physical Exam  Neck: Neck supple.  Cardiovascular: Normal rate, regular rhythm and normal heart sounds.   No murmur heard. Pulmonary/Chest: Effort normal and breath sounds normal. No respiratory distress. She has no wheezes. She has no rales.  Skin: No rash noted.  No erythema.  Vitals reviewed.         Assessment & Plan:  Chronic fatigue - Plan: CBC with Differential/Platelet, COMPLETE METABOLIC PANEL WITH GFR, TSH, Sedimentation rate  Polyarthralgia - Plan: Sedimentation rate, Rheumatoid factor  Migraine with aura and with status migrainosus, not intractable  Patient can discontinue Topamax. If migraines are time we will need to resume it. I believe her chronic fatigue and diffuse polyarthralgias are likely due to fibromyalgia syndrome. However I will check a sedimentation rate and rheumatoid factor to evaluate for rheumatoid arthritis and other autoimmune diseases. I will also check a CBC, CMP, and TSH to evaluate for other possible causes of fatigue.

## 2016-08-13 LAB — SEDIMENTATION RATE: SED RATE: 1 mm/h (ref 0–20)

## 2016-08-26 ENCOUNTER — Ambulatory Visit: Admitting: Family Medicine

## 2016-08-29 ENCOUNTER — Encounter: Admitting: Family Medicine

## 2016-10-02 ENCOUNTER — Encounter: Payer: Self-pay | Admitting: Physician Assistant

## 2016-10-02 ENCOUNTER — Ambulatory Visit (INDEPENDENT_AMBULATORY_CARE_PROVIDER_SITE_OTHER): Admitting: Physician Assistant

## 2016-10-02 VITALS — BP 110/62 | HR 87 | Temp 98.4°F | Resp 16 | Ht 61.0 in | Wt 134.0 lb

## 2016-10-02 DIAGNOSIS — J0100 Acute maxillary sinusitis, unspecified: Secondary | ICD-10-CM | POA: Diagnosis not present

## 2016-10-02 MED ORDER — PREDNISONE 20 MG PO TABS: ORAL_TABLET | ORAL | 0 refills | Status: DC

## 2016-10-02 MED ORDER — AMOXICILLIN-POT CLAVULANATE 875-125 MG PO TABS: 1.0000 | ORAL_TABLET | Freq: Two times a day (BID) | ORAL | 0 refills | Status: DC

## 2016-10-03 NOTE — Progress Notes (Signed)
    Patient ID: April Mcmillan MRN: [id number], DOB: Nov 08, 1971, 45 y.o. Date of Encounter: 10/03/2016, 8:09 AM    Chief Complaint:  Chief Complaint  Patient presents with  . Headache    sinus pressure, cough up mucus yellowish     HPI: 45 y.o. year old female presents with above.   Says she has had sinus pressure, nasal congestion for 2-3 weeks. Yesterday had low grade fever. Says the only cough she has is secondary to drainage down her throat. Now her ears are feeling sore and both side of her throat feel sore.      Home Meds:   Outpatient Medications Prior to Visit  Medication Sig Dispense Refill  . albuterol (PROVENTIL HFA;VENTOLIN HFA) 108 (90 Base) MCG/ACT inhaler Inhale 2 puffs into the lungs every 4 (four) hours as needed for wheezing or shortness of breath. 1 Inhaler 1  . cetirizine (ZYRTEC) 10 MG tablet Take 10 mg by mouth daily.    . fluticasone (FLONASE) 50 MCG/ACT nasal spray USE 2 SPRAYS IN EACH NOSTRIL AS NEEDED 16 g 3  . PATADAY 0.2 % SOLN USE 1 DROP IN EACH EYE ONCE DAILY FOR ALLERGIES 2.5 mL 0  . neomycin-polymyxin-hydrocortisone (CORTISPORIN) otic solution Place 3 drops into the left ear 4 (four) times daily. FOR 5 DAYS (Patient not taking: Reported on 10/02/2016) 10 mL 0  . topiramate (TOPAMAX) 25 MG tablet TAKE 2 TABLETS BY MOUTH TWICE DAILY (Patient not taking: Reported on 10/02/2016) 120 tablet 0  . amoxicillin-clavulanate (AUGMENTIN) 875-125 MG tablet Take 1 tablet by mouth 2 (two) times daily. (Patient not taking: Reported on 10/02/2016) 20 tablet 0   No facility-administered medications prior to visit.     Allergies: No Known Allergies    Review of Systems: See HPI for pertinent ROS. All other ROS negative.    Physical Exam: Blood pressure 110/62, pulse 87, temperature 98.4 F (36.9 C), temperature source Oral, resp. rate 16, height 5\' 1"  (1.549 m), weight 134 lb (60.8 kg), last menstrual period 08/02/2016, SpO2 99 %., Body mass index is 25.32  kg/m. General: WNWD WF. Appears in no acute distress. HEENT: Normocephalic, atraumatic, eyes without discharge, sclera non-icteric, nares are without discharge. Bilateral auditory canals clear, TM's are without perforation, pearly grey and translucent with reflective cone of light bilaterally. Oral cavity moist, posterior pharynx without exudate, erythema, peritonsillar abscess.Positive tenderness with percussion to frontal sinuses and maxillary sinuses bilaterally.  Neck: Supple. No thyromegaly. No lymphadenopathy. Lungs: Clear bilaterally to auscultation without wheezes, rales, or rhonchi. Breathing is unlabored. Heart: Regular rhythm. No murmurs, rubs, or gallops. Msk:  Strength and tone normal for age. Extremities/Skin: Warm and dry.  Neuro: Alert and oriented X 3. Moves all extremities spontaneously. Gait is normal. CNII-XII grossly in tact. Psych:  Responds to questions appropriately with a normal affect.     ASSESSMENT AND PLAN:  45 y.o. year old female with  1. Acute maxillary sinusitis, recurrence not specified She is to take the prednisone and Augmentin as directed. F/U if symptoms do not resolve upon completion of these medications.  - predniSONE (DELTASONE) 20 MG tablet; Take 3 daily for 2 days, then 2 daily for 2 days, then 1 daily for 2 days.  Dispense: 12 tablet; Refill: 0 - amoxicillin-clavulanate (AUGMENTIN) 875-125 MG tablet; Take 1 tablet by mouth 2 (two) times daily.  Dispense: 20 tablet; Refill: 0   Signed, [address] [place], [place], [facility] 10/03/2016 8:09 AM

## 2017-01-07 ENCOUNTER — Encounter: Payer: Self-pay | Admitting: Physician Assistant

## 2017-01-07 ENCOUNTER — Ambulatory Visit (INDEPENDENT_AMBULATORY_CARE_PROVIDER_SITE_OTHER): Admitting: Physician Assistant

## 2017-01-07 VITALS — BP 108/70 | HR 89 | Temp 98.0°F | Resp 16 | Wt 140.4 lb

## 2017-01-07 DIAGNOSIS — J0100 Acute maxillary sinusitis, unspecified: Secondary | ICD-10-CM

## 2017-01-07 MED ORDER — AMOXICILLIN-POT CLAVULANATE 875-125 MG PO TABS: 1.0000 | ORAL_TABLET | Freq: Two times a day (BID) | ORAL | 0 refills | Status: DC

## 2017-01-07 NOTE — Progress Notes (Signed)
Patient ID: April Mcmillan MRN: [id number], DOB: [birth year], 46 y.o. Date of Encounter: 01/07/2017, 3:05 PM    Chief Complaint:  Chief Complaint  Patient presents with  . Headache  . pressure behind both ears  . Nausea  . feeling tired  . Chills     HPI: 46 y.o. year old female presents with above.   Says that she has been having these symptoms for about 2 weeks--says that they have been getting progressively worse. Points to maxillary sinus region bilaterally and says that she is feeling a lot of pressure there. Says that it feels stopped up. Has not been able to blow much mucus out but feels like it's stuck in her sinuses. Also sneezing. No chest congestion.     Home Meds:   Outpatient Medications Prior to Visit  Medication Sig Dispense Refill  . albuterol (PROVENTIL HFA;VENTOLIN HFA) 108 (90 Base) MCG/ACT inhaler Inhale 2 puffs into the lungs every 4 (four) hours as needed for wheezing or shortness of breath. 1 Inhaler 1  . cetirizine (ZYRTEC) 10 MG tablet Take 10 mg by mouth daily.    . fluticasone (FLONASE) 50 MCG/ACT nasal spray USE 2 SPRAYS IN EACH NOSTRIL AS NEEDED 16 g 3  . PATADAY 0.2 % SOLN USE 1 DROP IN EACH EYE ONCE DAILY FOR ALLERGIES 2.5 mL 0  . amoxicillin-clavulanate (AUGMENTIN) 875-125 MG tablet Take 1 tablet by mouth 2 (two) times daily. (Patient not taking: Reported on 01/07/2017) 20 tablet 0  . neomycin-polymyxin-hydrocortisone (CORTISPORIN) otic solution Place 3 drops into the left ear 4 (four) times daily. FOR 5 DAYS (Patient not taking: Reported on 10/02/2016) 10 mL 0  . predniSONE (DELTASONE) 20 MG tablet Take 3 daily for 2 days, then 2 daily for 2 days, then 1 daily for 2 days. (Patient not taking: Reported on 01/07/2017) 12 tablet 0  . topiramate (TOPAMAX) 25 MG tablet TAKE 2 TABLETS BY MOUTH TWICE DAILY (Patient not taking: Reported on 10/02/2016) 120 tablet 0   No facility-administered medications prior to visit.     Allergies: No Known Allergies     Review of Systems: See HPI for pertinent ROS. All other ROS negative.    Physical Exam: Blood pressure 108/70, pulse 89, temperature 98 F (36.7 C), temperature source Oral, resp. rate 16, weight 140 lb 6.4 oz (63.7 kg), last menstrual period 12/17/2016, SpO2 98 %., Body mass index is 26.53 kg/m. General:  WNWD WF Appears in no acute distress. HEENT: Normocephalic, atraumatic, eyes without discharge, sclera non-icteric, nares are without discharge. Bilateral auditory canals clear, TM's are without perforation, pearly grey and translucent with reflective cone of light bilaterally. Oral cavity moist, posterior pharynx without exudate, erythema, peritonsillar abscess. Positive tenderness with percussion to maxillary sinuses bilaterally.  Neck: Supple. No thyromegaly. No lymphadenopathy. Lungs: Clear bilaterally to auscultation without wheezes, rales, or rhonchi. Breathing is unlabored. Heart: Regular rhythm. No murmurs, rubs, or gallops. Msk:  Strength and tone normal for age. Extremities/Skin: Warm and dry.  Neuro: Alert and oriented X 3. Moves all extremities spontaneously. Gait is normal. CNII-XII grossly in tact. Psych:  Responds to questions appropriately with a normal affect.     ASSESSMENT AND PLAN:  46 y.o. year old female with  1. Acute maxillary sinusitis, recurrence not specified Discussed also using prednisone for  symptom relief but patient defers this. She is to start antibiotic immediately take as directed complete all of it. Follow-up if symptoms do not resolve upon completion of this. - amoxicillin-clavulanate (AUGMENTIN)  875-125 MG tablet; Take 1 tablet by mouth 2 (two) times daily.  Dispense: 20 tablet; Refill: 0   Signed, [address] [place], [place], [facility] 01/07/2017 3:05 PM

## 2017-03-09 ENCOUNTER — Other Ambulatory Visit: Payer: Self-pay | Admitting: Family Medicine

## 2017-03-09 ENCOUNTER — Encounter: Payer: Self-pay | Admitting: Family Medicine

## 2017-03-09 ENCOUNTER — Ambulatory Visit (INDEPENDENT_AMBULATORY_CARE_PROVIDER_SITE_OTHER): Admitting: Family Medicine

## 2017-03-09 VITALS — BP 110/62 | HR 68 | Temp 98.3°F | Resp 14 | Ht 61.0 in | Wt 139.0 lb

## 2017-03-09 DIAGNOSIS — J329 Chronic sinusitis, unspecified: Secondary | ICD-10-CM | POA: Diagnosis not present

## 2017-03-09 DIAGNOSIS — J0110 Acute frontal sinusitis, unspecified: Secondary | ICD-10-CM | POA: Diagnosis not present

## 2017-03-09 DIAGNOSIS — Z9109 Other allergy status, other than to drugs and biological substances: Secondary | ICD-10-CM | POA: Diagnosis not present

## 2017-03-09 MED ORDER — CEFDINIR 300 MG PO CAPS: 300.0000 mg | ORAL_CAPSULE | Freq: Two times a day (BID) | ORAL | 0 refills | Status: DC

## 2017-03-09 MED ORDER — FLUCONAZOLE 150 MG PO TABS: 150.0000 mg | ORAL_TABLET | Freq: Once | ORAL | 1 refills | Status: AC

## 2017-03-09 MED ORDER — ALIGN 4 MG PO CAPS: ORAL_CAPSULE | ORAL | 2 refills | Status: AC

## 2017-03-09 MED ORDER — FLUTICASONE PROPIONATE 50 MCG/ACT NA SUSP: NASAL | 3 refills | Status: DC

## 2017-03-09 MED ORDER — METHYLPREDNISOLONE ACETATE 40 MG/ML IJ SUSP
40.0000 mg | Freq: Once | INTRAMUSCULAR | Status: AC
  Administered 2017-03-09: 40 mg via INTRAMUSCULAR

## 2017-03-09 NOTE — Progress Notes (Signed)
   Subjective:    Patient ID: April Mcmillan, female    DOB: July 25, 1971, 46 y.o.   MRN: [id number]  Patient presents for Sinus Pressure (x2 weeks- sinus pressure/ pain, nasal drip )   Pt here with sinus pressure /drainage, for past 2 weekshas history of recurrent sinus infections. She was treated in Jan with augmentin. She is already on pataday, zyrtec and flonase. Was seen by ENT in past but they had emergenecy so her visit was not completed she did not have scope done. Last treated in Jan for sinusitis by my office   Also follows with allergist she is concerned about enviromental and food allergens, has been tested in past by them and placed on allergy shots in past.      Review Of Systems:  GEN- denies fatigue, fever, weight loss,weakness, recent illness HEENT- denies eye drainage, change in vision, +nasal discharge, CVS- denies chest pain, palpitations RESP- denies SOB, cough, wheeze ABD- denies N/V, change in stools, abd pain GU- denies dysuria, hematuria, dribbling, incontinence MSK- denies joint pain, muscle aches, injury Neuro- denies headache, dizziness, syncope, seizure activity       Objective:    BP 110/62   Pulse 68   Temp 98.3 F (36.8 C) (Oral)   Resp 14   Ht 5\' 1"  (1.549 m)   Wt 139 lb (63 kg)   SpO2 99%   BMI 26.26 kg/m  GEN- NAD, alert and oriented x3 HEENT- PERRL, EOMI, non injected sclera, pink conjunctiva, MMM, oropharynx CLEAR, TM clear bilat no effusion, enlarged turbinates  + maxillary sinus tenderness, inflammed turbinates,  Nasal drainage  Neck- Supple, shotty  LAD CVS- RRR, no murmur RESP-CTAB Pulses- Radial 2+           Assessment & Plan:      Problem List Items Addressed This Visit    Sinusitis, acute - Primary   Relevant Medications   cefdinir (OMNICEF) 300 MG capsule   fluconazole (DIFLUCAN) 150 MG tablet   methylPREDNISolone acetate (DEPO-MEDROL) injection 40 mg (Completed)   Recurrent sinus infections    Treat today with  Depo Medrol injection, omnicef Anti-histamine flonase Obtain repeat CT max/face  fOR HER Multiple enviromental and possible food intolerance recommend she go back to her allergist      Relevant Medications   cefdinir (OMNICEF) 300 MG capsule   fluconazole (DIFLUCAN) 150 MG tablet   methylPREDNISolone acetate (DEPO-MEDROL) injection 40 mg (Completed)   Other Relevant Orders   CT Maxillofacial WO CM    Other Visit Diagnoses    Environmental allergies       Relevant Orders   Ambulatory referral to Allergy      Note: This dictation was prepared with Dragon dictation along with smaller phrase technology. Any transcriptional errors that result from this process are unintentional.

## 2017-03-09 NOTE — Patient Instructions (Addendum)
Referral for CT scan of sinuses Continue the allergy medication Referral back to your allergist

## 2017-03-09 NOTE — Assessment & Plan Note (Signed)
Treat today with Depo Medrol injection, omnicef Anti-histamine flonase Obtain repeat CT max/face  fOR HER Multiple enviromental and possible food intolerance recommend she go back to her allergist

## 2017-03-10 ENCOUNTER — Other Ambulatory Visit: Payer: Self-pay | Admitting: *Deleted

## 2017-03-10 ENCOUNTER — Telehealth: Payer: Self-pay | Admitting: *Deleted

## 2017-03-10 NOTE — Telephone Encounter (Signed)
Tell her not covered she can pay out pocket, if not change to Azelastine 1 drop BID prn

## 2017-03-10 NOTE — Telephone Encounter (Signed)
Received fax requesting change of medication on Pataday Gtts  States that insurance will not cover at this time.   Formulary alternatives include: Azelastine HCL Epinastine HCL  MD please advise.

## 2017-03-17 ENCOUNTER — Ambulatory Visit
Admission: RE | Admit: 2017-03-17 | Discharge: 2017-03-17 | Disposition: A | Discharge status: Home / Self Care | Source: Ambulatory Visit | Attending: Family Medicine | Admitting: Family Medicine

## 2017-03-17 DIAGNOSIS — J329 Chronic sinusitis, unspecified: Secondary | ICD-10-CM

## 2017-03-17 MED ORDER — OLOPATADINE HCL 0.1 % OP SOLN: 1.0000 [drp] | Freq: Two times a day (BID) | OPHTHALMIC | 12 refills | Status: DC | PRN

## 2017-03-17 NOTE — Telephone Encounter (Signed)
Call placed to pharmacy.   Olopatadine (PATANOL) 0.1 % ophthalmic solution is covered with no PA.   Prescription sent to pharmacy.

## 2017-03-19 ENCOUNTER — Other Ambulatory Visit: Payer: Self-pay | Admitting: *Deleted

## 2017-03-19 DIAGNOSIS — J329 Chronic sinusitis, unspecified: Secondary | ICD-10-CM

## 2017-04-20 DIAGNOSIS — G8929 Other chronic pain: Secondary | ICD-10-CM | POA: Insufficient documentation

## 2017-04-20 DIAGNOSIS — J342 Deviated nasal septum: Secondary | ICD-10-CM | POA: Insufficient documentation

## 2017-04-20 DIAGNOSIS — R519 Headache, unspecified: Secondary | ICD-10-CM | POA: Insufficient documentation

## 2017-06-17 ENCOUNTER — Other Ambulatory Visit: Payer: Self-pay | Admitting: Physician Assistant

## 2017-06-17 ENCOUNTER — Encounter: Payer: Self-pay | Admitting: Physician Assistant

## 2017-06-17 ENCOUNTER — Ambulatory Visit
Admission: RE | Admit: 2017-06-17 | Discharge: 2017-06-17 | Disposition: A | Discharge status: Home / Self Care | Source: Ambulatory Visit | Attending: Physician Assistant | Admitting: Physician Assistant

## 2017-06-17 ENCOUNTER — Ambulatory Visit (INDEPENDENT_AMBULATORY_CARE_PROVIDER_SITE_OTHER): Admitting: Physician Assistant

## 2017-06-17 VITALS — BP 102/74 | HR 85 | Temp 98.0°F | Resp 16 | Ht 61.0 in | Wt 137.6 lb

## 2017-06-17 DIAGNOSIS — M545 Low back pain, unspecified: Secondary | ICD-10-CM

## 2017-06-17 DIAGNOSIS — J0100 Acute maxillary sinusitis, unspecified: Secondary | ICD-10-CM | POA: Diagnosis not present

## 2017-06-17 MED ORDER — MELOXICAM 7.5 MG PO TABS: 7.5000 mg | ORAL_TABLET | Freq: Every day | ORAL | 0 refills | Status: DC

## 2017-06-17 MED ORDER — PREDNISONE 20 MG PO TABS: ORAL_TABLET | ORAL | 0 refills | Status: DC

## 2017-06-17 MED ORDER — CYCLOBENZAPRINE HCL 10 MG PO TABS: 10.0000 mg | ORAL_TABLET | Freq: Three times a day (TID) | ORAL | 0 refills | Status: DC | PRN

## 2017-06-17 MED ORDER — FLUCONAZOLE 150 MG PO TABS: 150.0000 mg | ORAL_TABLET | Freq: Once | ORAL | 0 refills | Status: AC

## 2017-06-17 MED ORDER — AMOXICILLIN-POT CLAVULANATE 875-125 MG PO TABS: 1.0000 | ORAL_TABLET | Freq: Two times a day (BID) | ORAL | 0 refills | Status: DC

## 2017-06-17 NOTE — Progress Notes (Signed)
Patient ID: April Mcmillan MRN: [id number], DOB: [birth year], 46 y.o. Date of Encounter: @DATE @  Chief Complaint:  Chief Complaint  Patient presents with  . Sinusitis    started 7/4  . Back Pain    ongoing about x3years    HPI: 46 y.o. year old female  presents with above.  Says she had cough for ~ 1 week. Then on 06/10/2017 developed pain in sinuses. And started feeling "weak and sick". And lymph nodes in neck felt sore.   Points to bilateral low back at ~ L3 - L5 level and says that area hurts a little bit "all the time." But then sometimes when twists, feels "jolt of pain". Says feels this several times per week. Wants to get XRay to evaluate.  She does not work a job. She is in school, taking classes. Sits a lot. Does no routine exercise.   No pain, numbness, tingling down either leg.   No other concerns to address today.   Past Medical History:  Diagnosis Date  . Allergy      Home Meds: Outpatient Medications Prior to Visit  Medication Sig Dispense Refill  . albuterol (PROVENTIL HFA;VENTOLIN HFA) 108 (90 Base) MCG/ACT inhaler Inhale 2 puffs into the lungs every 4 (four) hours as needed for wheezing or shortness of breath. 1 Inhaler 1  . fluticasone (FLONASE) 50 MCG/ACT nasal spray INSTILL 2 SPRAYS IN EACH NOSTRIL AS NEEDED 48 g 3  . olopatadine (PATANOL) 0.1 % ophthalmic solution Place 1 drop into both eyes 2 (two) times daily as needed for allergies. 5 mL 12  . Probiotic Product (ALIGN) 4 MG CAPS 1 capsule daily 30 capsule 2  . cefdinir (OMNICEF) 300 MG capsule Take 1 capsule (300 mg total) by mouth 2 (two) times daily. 20 capsule 0   No facility-administered medications prior to visit.     Allergies:  Allergies  Allergen Reactions  . Levaquin [Levofloxacin In D5w]     Unclear reaction in past     Social History   Social History  . Marital status: Married    Spouse name: Legrand Como   . Number of children: 4  . Years of education: 12+   Occupational History    . Student     Social History Main Topics  . Smoking status: Current Some Day Smoker    Packs/day: 0.50    Types: Cigarettes  . Smokeless tobacco: Never Used  . Alcohol use 0.0 oz/week     Comment: rare -ocassionally  . Drug use: No  . Sexual activity: Yes    Birth control/ protection: Surgical     Comment: 1st intercourse 46 yo-More than 5 partners-BTL   Other Topics Concern  . Not on file   Social History Narrative   Patient lives at home with husband Legrand Como    Patient has 4 children    Patient has 12+ years of education.    Patient is right handed.           Family History  Problem Relation Age of Onset  . Hypertension Mother   . Breast cancer Sister 52     Review of Systems:  See HPI for pertinent ROS. All other ROS negative.    Physical Exam: Blood pressure 102/74, pulse 85, temperature 98 F (36.7 C), temperature source Oral, resp. rate 16, height 5\' 1"  (1.549 m), weight 137 lb 9.6 oz (62.4 kg), SpO2 99 %., Body mass index is 26 kg/m. General: WNWD WF. Appears in no acute  distress. Head: Normocephalic, atraumatic, eyes without discharge, sclera non-icteric, nares are without discharge. Bilateral auditory canals clear, TM's are without perforation, pearly grey and translucent with reflective cone of light bilaterally. Oral cavity moist, posterior pharynx without exudate, erythema. Positie tenderness with percussion on bilateral maxillary sinuses.  Neck: Supple. No thyromegaly. No lymphadenopathy. Lungs: Clear bilaterally to auscultation without wheezes, rales, or rhonchi. Breathing is unlabored. Heart: RRR with S1 S2. No murmurs, rubs, or gallops. Musculoskeletal:  Strength and tone normal for age. SLR and Hip Abduction normal bilaterlly.  Extremities/Skin: Warm and dry. Neuro: Alert and oriented X 3. Moves all extremities spontaneously. Gait is normal. CNII-XII grossly in tact. Psych:  Responds to questions appropriately with a normal affect.     ASSESSMENT  AND PLAN:  46 y.o. year old female with  1. Acute maxillary sinusitis, recurrence not specified States abx causes yeat infxn.  To take Abx and Pred as directed. Take Diflucan if needed. F/U if sx do not resolve upon completion of abx and pred. - amoxicillin-clavulanate (AUGMENTIN) 875-125 MG tablet; Take 1 tablet by mouth 2 (two) times daily.  Dispense: 20 tablet; Refill: 0 - predniSONE (DELTASONE) 20 MG tablet; Take 3 daily for 2 days, then 2 daily for 2 days, then 1 daily for 2 days.  Dispense: 12 tablet; Refill: 0 - fluconazole (DIFLUCAN) 150 MG tablet; Take 1 tablet (150 mg total) by mouth once.  Dispense: 1 tablet; Refill: 0  2. Bilateral low back pain without sciatica, unspecified chronicity Will obtain XRay. Will add NSAID and Muscle Relaxer. Cautioned reg possible drowsiness- do not take prior to driving. Take only at bedtime if causes drowsiness.  - DG Lumbar Spine Complete; Future - meloxicam (MOBIC) 7.5 MG tablet; Take 1 tablet (7.5 mg total) by mouth daily. With food.  Dispense: 30 tablet; Refill: 0 - cyclobenzaprine (FLEXERIL) 10 MG tablet; Take 1 tablet (10 mg total) by mouth 3 (three) times daily as needed for muscle spasms.  Dispense: 30 tablet; Refill: 0   Signed, [address] [facility], [place], [facility] 06/17/2017 2:58 PM

## 2017-07-28 ENCOUNTER — Encounter: Payer: Self-pay | Admitting: Family Medicine

## 2017-07-28 ENCOUNTER — Ambulatory Visit (INDEPENDENT_AMBULATORY_CARE_PROVIDER_SITE_OTHER): Admitting: Family Medicine

## 2017-07-28 VITALS — BP 118/74 | HR 68 | Temp 98.0°F | Resp 14 | Ht 61.0 in | Wt 136.0 lb

## 2017-07-28 DIAGNOSIS — J0111 Acute recurrent frontal sinusitis: Secondary | ICD-10-CM

## 2017-07-28 MED ORDER — AMOXICILLIN-POT CLAVULANATE 875-125 MG PO TABS: 1.0000 | ORAL_TABLET | Freq: Two times a day (BID) | ORAL | 0 refills | Status: DC

## 2017-07-28 NOTE — Progress Notes (Signed)
Subjective:    Patient ID: April Mcmillan, female    DOB: 22-Aug-1971, 46 y.o.   MRN: [id number]  HPI Patients had 2 weeks of maxillary sinus tenderness to palpation, postnasal drip, nasal congestion, and dull fevers. She does have a history of frequent migraines but this feels different. Pain is worse on either side of her nasal bridge radiating into the ethmoid sinus area. She also reports pain and pressure behind both ears, tender lymphadenopathy in the neck, and postnasal drip Past Medical History:  Diagnosis Date  . Allergy    Past Surgical History:  Procedure Laterality Date  . TONSILLECTOMY    . TUBAL LIGATION     Current Outpatient Prescriptions on File Prior to Visit  Medication Sig Dispense Refill  . albuterol (PROVENTIL HFA;VENTOLIN HFA) 108 (90 Base) MCG/ACT inhaler Inhale 2 puffs into the lungs every 4 (four) hours as needed for wheezing or shortness of breath. 1 Inhaler 1  . fluticasone (FLONASE) 50 MCG/ACT nasal spray INSTILL 2 SPRAYS IN EACH NOSTRIL AS NEEDED 48 g 3  . levocetirizine (XYZAL) 5 MG tablet Take 5 mg by mouth daily.  5  . olopatadine (PATANOL) 0.1 % ophthalmic solution Place 1 drop into both eyes 2 (two) times daily as needed for allergies. 5 mL 12  . Probiotic Product (ALIGN) 4 MG CAPS 1 capsule daily 30 capsule 2  . cyclobenzaprine (FLEXERIL) 10 MG tablet Take 1 tablet (10 mg total) by mouth 3 (three) times daily as needed for muscle spasms. (Patient not taking: Reported on 07/28/2017) 30 tablet 0   No current facility-administered medications on file prior to visit.    Allergies  Allergen Reactions  . Levaquin [Levofloxacin In D5w]     Unclear reaction in past    Social History   Social History  . Marital status: Married    Spouse name: Legrand Como   . Number of children: 4  . Years of education: 12+   Occupational History  . Student     Social History Main Topics  . Smoking status: Current Some Day Smoker    Packs/day: 0.50    Types: Cigarettes    . Smokeless tobacco: Never Used  . Alcohol use 0.0 oz/week     Comment: rare -ocassionally  . Drug use: No  . Sexual activity: Yes    Birth control/ protection: Surgical     Comment: 1st intercourse 46 yo-More than 5 partners-BTL   Other Topics Concern  . Not on file   Social History Narrative   Patient lives at home with husband Legrand Como    Patient has 4 children    Patient has 12+ years of education.    Patient is right handed.             Review of Systems  All other systems reviewed and are negative.      Objective:   Physical Exam  Constitutional: She appears well-developed and well-nourished.  HENT:  Right Ear: External ear normal.  Left Ear: External ear normal.  Nose: Mucosal edema and rhinorrhea present. Right sinus exhibits maxillary sinus tenderness. Left sinus exhibits maxillary sinus tenderness.  Mouth/Throat: Oropharynx is clear and moist. No oropharyngeal exudate.  Cardiovascular: Normal rate and regular rhythm.   Pulmonary/Chest: Effort normal and breath sounds normal.  Vitals reviewed.         Assessment & Plan:  Acute recurrent frontal sinusitis - Plan: amoxicillin-clavulanate (AUGMENTIN) 875-125 MG tablet  I will treat the patient with Augmentin 875 mg by mouth  twice a day for 14 days.

## 2017-08-05 ENCOUNTER — Encounter: Payer: Self-pay | Admitting: Gynecology

## 2017-08-05 ENCOUNTER — Ambulatory Visit (INDEPENDENT_AMBULATORY_CARE_PROVIDER_SITE_OTHER): Admitting: Gynecology

## 2017-08-05 VITALS — BP 118/76

## 2017-08-05 DIAGNOSIS — N921 Excessive and frequent menstruation with irregular cycle: Secondary | ICD-10-CM

## 2017-08-05 NOTE — Patient Instructions (Signed)
Follow-up for the ultrasound as scheduled. 

## 2017-08-05 NOTE — Progress Notes (Signed)
    April Mcmillan 1971/02/26 [id number]        46 y.o.  G3P3 presents complaining of heavy monthly menses with bleeding on and off between over the last year. She does have a long history of heavy menses. She underwent a sonohysterogram 2 years ago which was normal. Having bleedthrough episodes/double protection. Status post BTL in the past. Pap smear/HPV negative 2016.  Past medical history,surgical history, problem list, medications, allergies, family history and social history were all reviewed and documented in the EPIC chart.  Directed ROS with pertinent positives and negatives documented in the history of present illness/assessment and plan.  Exam: Caryn Bee assistant Vitals:   08/05/17 1510  BP: 118/76   General appearance:  Normal Abdomen soft nontender without masses guarding rebound Pelvic external BUS vagina with scant bleeding. Cervix normal. Uterus normal size midline mobile nontender. Adnexa without masses or tenderness.  Assessment/Plan:  46 y.o. G3P3 with long history of heavy menses now with intermenstrual bleeding. Check baseline CBC and TSH. Schedule sonohysterogram. Differential reviewed to include hormonal dysfunction, adenomyosis, leiomyoma/endometrial polyps. Options for treatment to include hormonal regulation, Mirena IUD, endometrial ablation, hysterectomy all reviewed. Patient will follow up for her lab results and ultrasound and then we will go from there.    Anastasio Auerbach MD, 3:51 PM 08/05/2017

## 2017-09-03 ENCOUNTER — Telehealth: Payer: Self-pay

## 2017-09-03 NOTE — Telephone Encounter (Signed)
Patient called to ask if [facility] on [address] really necessary since she had u/s 2 years ago .  I explained benefits of [facility].   I also reminded patient that she left last visit without having her labs drawn.  I scheduled her for a lab appointment for Monday in hopes that results will be back in time for visit.  She had questioned if insurance had been checked for [facility] because she said she was told it would be. I sent Abigail Butts a staff message to call her about this.

## 2017-09-07 ENCOUNTER — Other Ambulatory Visit

## 2017-09-07 ENCOUNTER — Other Ambulatory Visit: Payer: Self-pay | Admitting: Gynecology

## 2017-09-07 DIAGNOSIS — N921 Excessive and frequent menstruation with irregular cycle: Secondary | ICD-10-CM

## 2017-09-07 LAB — CBC WITH DIFFERENTIAL/PLATELET
BASOS PCT: 0.4 %
Basophils Absolute: 37 cells/uL (ref 0–200)
EOS ABS: 18 {cells}/uL (ref 15–500)
Eosinophils Relative: 0.2 %
HCT: 37.1 % (ref 35.0–45.0)
HEMOGLOBIN: 12.3 g/dL (ref 11.7–15.5)
LYMPHS ABS: 1987 {cells}/uL (ref 850–3900)
MCH: 29.7 pg (ref 27.0–33.0)
MCHC: 33.2 g/dL (ref 32.0–36.0)
MCV: 89.6 fL (ref 80.0–100.0)
MPV: 11.6 fL (ref 7.5–12.5)
Monocytes Relative: 7.7 %
NEUTROS ABS: 6449 {cells}/uL (ref 1500–7800)
Neutrophils Relative %: 70.1 %
Platelets: 219 10*3/uL (ref 140–400)
RBC: 4.14 10*6/uL (ref 3.80–5.10)
RDW: 11.8 % (ref 11.0–15.0)
Total Lymphocyte: 21.6 %
WBC: 9.2 10*3/uL (ref 3.8–10.8)
WBCMIX: 708 {cells}/uL (ref 200–950)

## 2017-09-07 LAB — TSH: TSH: 1.31 mIU/L

## 2017-09-08 ENCOUNTER — Telehealth: Payer: Self-pay | Admitting: *Deleted

## 2017-09-08 NOTE — Telephone Encounter (Signed)
Pt called stating bleeding has slowed down today, yesterday bleeding was heavier side, wearing pads, only used 1 pad today so far. Pt will come tomorrow for [facility]

## 2017-09-08 NOTE — Telephone Encounter (Signed)
Pt spoke with wendy at front desk this am regarding [facility] stating still bleeding wanted to confirm if okay to proceed with test. I tried to call pt but voicemail is full.

## 2017-09-09 ENCOUNTER — Ambulatory Visit (INDEPENDENT_AMBULATORY_CARE_PROVIDER_SITE_OTHER)

## 2017-09-09 ENCOUNTER — Other Ambulatory Visit: Payer: Self-pay | Admitting: Gynecology

## 2017-09-09 ENCOUNTER — Encounter: Payer: Self-pay | Admitting: Gynecology

## 2017-09-09 ENCOUNTER — Ambulatory Visit (INDEPENDENT_AMBULATORY_CARE_PROVIDER_SITE_OTHER): Admitting: Gynecology

## 2017-09-09 VITALS — BP 116/74

## 2017-09-09 DIAGNOSIS — N921 Excessive and frequent menstruation with irregular cycle: Secondary | ICD-10-CM

## 2017-09-09 DIAGNOSIS — N83202 Unspecified ovarian cyst, left side: Secondary | ICD-10-CM

## 2017-09-09 NOTE — Patient Instructions (Signed)
Office will call you with biopsy results.  Follow up if you decide to pursue treatment as we discussed.

## 2017-09-09 NOTE — Progress Notes (Signed)
    April Mcmillan 1971-12-04 [id number]        46 y.o.  G3P3 presents for sonohysterogram. History of heavy menses for years. Sonohysterogram 2 years ago was negative.  Continues to have heavy menses but now has started to bleed in between her menses. Has episodes of bleedthrough clothing and double protection. Status post BTL in the past. Pap smear/HPV -2016.  Past medical history,surgical history, problem list, medications, allergies, family history and social history were all reviewed and documented in the EPIC chart.  Directed ROS with pertinent positives and negatives documented in the history of present illness/assessment and plan.  Exam: Sallee Lange assistant Vitals:   09/09/17 1549  BP: 116/74   General appearance:  Normal Abdomen soft nontender without masses guarding rebound Pelvic external BUS vagina with menses-like flow. Cervix normal. Uterus grossly normal midline mobile nontender. Adnexa without masses or tenderness.  Ultrasound transabdominal and transvaginal shows uterus generous in size with inhomogeneous pattern with several small cystic areas suggesting adenomyosis. Small myoma 17 x 13 mm noted. Endometrial echo 11.1 mm. Right ovary normal. Left ovary with 2 avascular thin-walled cysts 23 x 20 x 22 mm and 17 x 8 x 16 mm consistent with physiologic changes. Cul-de-sac negative.  Sonohysterogram performed, sterile technique, easy catheter introduction, difficult to maintain distention but good visualization on pulsatile infusion. No abnormality seen. Endometrial biopsy taken. Patient tolerated well.  Assessment/Plan:  46 y.o. G3P3 with menorrhagia and irregular bleeding. Reviewed ultrasound findings with the patient to include the small myoma, possible adenomyosis and physiologic appearing left ovarian cysts. Patient will follow up for biopsy results in several days. Options for management were reviewed to include expectant, hormonal manipulation such as low-dose oral  contraceptives, progesterone only, Mirena IUD, endometrial ablation and hysterectomy. The pros and cons of each choice were reviewed as well as the risks. At this point the patient does not want intervention. Her hemoglobin recently was 12.3 and I encouraged her to continue on a multivitamin with iron. She will call me if she decides to pursue treatment. Otherwise of recommend she follow up after the first of the year for annual exam is she is overdue and we'll see how she doing at that time.    Anastasio Auerbach MD, 4:23 PM 09/09/2017

## 2017-11-04 ENCOUNTER — Ambulatory Visit (INDEPENDENT_AMBULATORY_CARE_PROVIDER_SITE_OTHER): Admitting: Family Medicine

## 2017-11-04 ENCOUNTER — Other Ambulatory Visit: Payer: Self-pay

## 2017-11-04 ENCOUNTER — Encounter: Payer: Self-pay | Admitting: Family Medicine

## 2017-11-04 VITALS — BP 112/68 | HR 100 | Temp 98.2°F | Resp 16 | Ht 61.0 in | Wt 137.0 lb

## 2017-11-04 DIAGNOSIS — J0100 Acute maxillary sinusitis, unspecified: Secondary | ICD-10-CM | POA: Diagnosis not present

## 2017-11-04 DIAGNOSIS — J302 Other seasonal allergic rhinitis: Secondary | ICD-10-CM

## 2017-11-04 MED ORDER — CEFDINIR 300 MG PO CAPS: 300.0000 mg | ORAL_CAPSULE | Freq: Two times a day (BID) | ORAL | 0 refills | Status: DC

## 2017-11-04 MED ORDER — IPRATROPIUM BROMIDE 0.03 % NA SOLN: 2.0000 | Freq: Two times a day (BID) | NASAL | 12 refills | Status: DC

## 2017-11-04 MED ORDER — FLUCONAZOLE 150 MG PO TABS: ORAL_TABLET | ORAL | 0 refills | Status: DC

## 2017-11-04 NOTE — Progress Notes (Signed)
   Subjective:    Patient ID: April Mcmillan, female    DOB: [birth year], 46 y.o.   MRN: [id number]  Patient presents for Illness (x1 week- sinus pressure, productive cough with yellow mucus, post nasal drip, facial pain)  Migraines has improved on topamax, made changes in diet and has  Not had headaches.  Sinus pressure and drainage chronically, but past week has had pressure, facial pain. No fever. Has some cough. Using saline rinse and vicks humidifer, probiotic    Using eye allergy drop and xyzal and flonase     Review Of Systems:  GEN- denies fatigue, fever, weight loss,weakness, recent illness HEENT- denies eye drainage, change in vision,+ nasal discharge, CVS- denies chest pain, palpitations RESP- denies SOB, cough, wheeze ABD- denies N/V, change in stools, abd pain GU- denies dysuria, hematuria, dribbling, incontinence MSK- denies joint pain, muscle aches, injury Neuro- denies headache, dizziness, syncope, seizure activity       Objective:    BP 112/68   Pulse 100   Temp 98.2 F (36.8 C) (Oral)   Resp 16   Ht 5\' 1"  (1.549 m)   Wt 137 lb (62.1 kg)   SpO2 96%   BMI 25.89 kg/m  GEN- NAD, alert and oriented x3 HEENT- PERRL, EOMI, non injected sclera, pink conjunctiva, MMM, oropharynx mild injection, TM clear bilat no effusion,  + maxillary/frontal sinus tenderness, inflammed turbinates,  Nasal drainage  Neck- Supple, shotty LAD CVS- RRR, no murmur RESP-CTAB EXT- No edema Pulses- Radial 2+         Assessment & Plan:      Problem List Items Addressed This Visit      Unprioritized   Seasonal allergies   Sinusitis, acute - Primary    Has seen ENT, had CT scan, but they did not feel it was consisten with chronic sinuitis Treat with omnicef, change to atrovent for the rhinitis temporarily and see how she does , continue the allergy regimenin, eye drop, anti-histamine  Diflucan due to yeast infection after antibiotics      Relevant Medications   ipratropium (ATROVENT) 0.03 % nasal spray   cefdinir (OMNICEF) 300 MG capsule   fluconazole (DIFLUCAN) 150 MG tablet      Note: This dictation was prepared with Dragon dictation along with smaller phrase technology. Any transcriptional errors that result from this process are unintentional.

## 2017-11-04 NOTE — Patient Instructions (Signed)
Try the atrovent Take the antibiotics  F/U as needed

## 2017-11-05 ENCOUNTER — Encounter: Payer: Self-pay | Admitting: Family Medicine

## 2017-11-05 NOTE — Assessment & Plan Note (Signed)
Has seen ENT, had CT scan, but they did not feel it was consisten with chronic sinuitis Treat with omnicef, change to atrovent for the rhinitis temporarily and see how she does , continue the allergy regimenin, eye drop, anti-histamine  Diflucan due to yeast infection after antibiotics

## 2017-11-06 ENCOUNTER — Ambulatory Visit: Admitting: Family Medicine

## 2017-12-23 ENCOUNTER — Ambulatory Visit (INDEPENDENT_AMBULATORY_CARE_PROVIDER_SITE_OTHER): Admitting: Gynecology

## 2017-12-23 ENCOUNTER — Encounter: Payer: Self-pay | Admitting: Gynecology

## 2017-12-23 VITALS — BP 118/76 | Ht 61.0 in | Wt 138.0 lb

## 2017-12-23 DIAGNOSIS — Z01419 Encounter for gynecological examination (general) (routine) without abnormal findings: Secondary | ICD-10-CM

## 2017-12-23 NOTE — Progress Notes (Signed)
    April Mcmillan [birth year] [id number]        46 y.o.  G3P3 for annual gynecologic exam.  History of menorrhagia with negative sonohysterogram and negative endometrial biopsy 09/2017.  Patient notes that actually her periods have become much lighter and acceptable after the sonohysterogram.  Past medical history,surgical history, problem list, medications, allergies, family history and social history were all reviewed and documented as reviewed in the EPIC chart.  ROS:  Performed with pertinent positives and negatives included in the history, assessment and plan.   Additional significant findings : None   Exam: Caryn Bee assistant Vitals:   12/23/17 1439  BP: 118/76  Weight: 138 lb (62.6 kg)  Height: 5\' 1"  (1.549 m)   Body mass index is 26.07 kg/m.  General appearance:  Normal affect, orientation and appearance. Skin: Grossly normal HEENT: Without gross lesions.  No cervical or supraclavicular adenopathy. Thyroid normal.  Lungs:  Clear without wheezing, rales or rhonchi Cardiac: RR, without RMG Abdominal:  Soft, nontender, without masses, guarding, rebound, organomegaly or hernia Breasts:  Examined lying and sitting without masses, retractions, discharge or axillary adenopathy. Pelvic:  Ext, BUS, Vagina: Normal  Cervix: Normal  Uterus: Anteverted, normal size, shape and contour, midline and mobile nontender   Adnexa: Without masses or tenderness    Anus and perineum: Normal   Rectovaginal: Normal sphincter tone without palpated masses or tenderness.    Assessment/Plan:  47 y.o. G3P3 female for annual gynecologic exam with regular menses, tubal sterilization.   1. Mammography 2011.  I again strongly recommended patient schedule with screening mammogram.  Most common cancer in women reviewed and the benefits of early detection discussed.  Patient promises to schedule this year.  Names and numbers provided.  Breast exam normal today. 2. Pap smear/HPV 09/2015.  No Pap smear done  today.  No history of significant abnormal Pap smears.  Plan repeat Pap smear at 5-year interval per current screening guidelines. 3. Health maintenance.  No routine lab work done as patient does this elsewhere.  Follow-up 1 year, sooner as needed.   Anastasio Auerbach MD, 3:19 PM 12/23/2017

## 2017-12-23 NOTE — Patient Instructions (Signed)
Call to Schedule your mammogram  Facilities in [place]: 1)  [facility]. [facility], [address] Phone: [phone] 2)  Dr. Bertrand at [facility]  [address] Phone: [phone]     Mammogram A mammogram is an X-ray test to find changes in a woman's breast. You should get a mammogram if:  You are 49 years of age or older  You have risk factors.   Your doctor recommends that you have one.  BEFORE THE TEST  Do not schedule the test the week before your period, especially if your breasts are sore during this time.  On the day of your mammogram:  Wash your breasts and armpits well. After washing, do not put on any deodorant or talcum powder on until after your test.   Eat and drink as you usually do.   Take your medicines as usual.   If you are diabetic and take insulin, make sure you:   Eat before coming for your test.   Take your insulin as usual.   If you cannot keep your appointment, call before the appointment to cancel. Schedule another appointment.  TEST  You will need to undress from the waist up. You will put on a hospital gown.   Your breast will be put on the mammogram machine, and it will press firmly on your breast with a piece of plastic called a compression paddle. This will make your breast flatter so that the machine can X-ray all parts of your breast.   Both breasts will be X-rayed. Each breast will be X-rayed from above and from the side. An X-ray might need to be taken again if the picture is not good enough.   The mammogram will last about 15 to 30 minutes.  AFTER THE TEST Finding out the results of your test Ask when your test results will be ready. Make sure you get your test results.  Document Released: 02/20/2009 Document Revised: 11/13/2011 Document Reviewed: 02/20/2009 [employer] Patient Information 2012 [employer].   

## 2018-01-12 ENCOUNTER — Ambulatory Visit (INDEPENDENT_AMBULATORY_CARE_PROVIDER_SITE_OTHER): Admitting: Family Medicine

## 2018-01-12 ENCOUNTER — Encounter: Payer: Self-pay | Admitting: Family Medicine

## 2018-01-12 VITALS — BP 112/80 | HR 72 | Temp 97.5°F | Resp 12 | Ht 61.0 in | Wt 141.0 lb

## 2018-01-12 DIAGNOSIS — B9689 Other specified bacterial agents as the cause of diseases classified elsewhere: Secondary | ICD-10-CM | POA: Diagnosis not present

## 2018-01-12 DIAGNOSIS — J0100 Acute maxillary sinusitis, unspecified: Secondary | ICD-10-CM | POA: Diagnosis not present

## 2018-01-12 DIAGNOSIS — J019 Acute sinusitis, unspecified: Secondary | ICD-10-CM | POA: Diagnosis not present

## 2018-01-12 MED ORDER — AMOXICILLIN-POT CLAVULANATE 875-125 MG PO TABS: 1.0000 | ORAL_TABLET | Freq: Two times a day (BID) | ORAL | 0 refills | Status: DC

## 2018-01-12 MED ORDER — FLUCONAZOLE 150 MG PO TABS
150.0000 mg | ORAL_TABLET | Freq: Once | ORAL | 0 refills | Status: AC
Start: 2018-01-12 — End: 2018-01-12

## 2018-01-12 NOTE — Progress Notes (Signed)
Subjective:    Patient ID: April Mcmillan, female    DOB: 1971/05/11, 47 y.o.   MRN: [id number]  HPI Patient reports 1-2 weeks of maxillary sinus pain.  Over the last 5 days pain worsened significantly and now she has tenderness to palpation, postnasal drip, nasal congestion, and dull fevers.  She also reports pain and pressure behind both ears Past Medical History:  Diagnosis Date  . Allergy    Past Surgical History:  Procedure Laterality Date  . TONSILLECTOMY    . TUBAL LIGATION     Current Outpatient Medications on File Prior to Visit  Medication Sig Dispense Refill  . albuterol (PROVENTIL HFA;VENTOLIN HFA) 108 (90 Base) MCG/ACT inhaler Inhale 2 puffs into the lungs every 4 (four) hours as needed for wheezing or shortness of breath. 1 Inhaler 1  . fluticasone (FLONASE) 50 MCG/ACT nasal spray INSTILL 2 SPRAYS IN EACH NOSTRIL AS NEEDED 48 g 3  . ipratropium (ATROVENT) 0.03 % nasal spray Place 2 sprays into both nostrils every 12 (twelve) hours. 30 mL 12  . levocetirizine (XYZAL) 5 MG tablet Take 5 mg by mouth daily.  5  . olopatadine (PATANOL) 0.1 % ophthalmic solution Place 1 drop into both eyes 2 (two) times daily as needed for allergies. 5 mL 12  . Probiotic Product (ALIGN) 4 MG CAPS 1 capsule daily 30 capsule 2  . cyclobenzaprine (FLEXERIL) 10 MG tablet Take 1 tablet (10 mg total) by mouth 3 (three) times daily as needed for muscle spasms. (Patient not taking: Reported on 11/04/2017) 30 tablet 0   No current facility-administered medications on file prior to visit.    Allergies  Allergen Reactions  . Levaquin [Levofloxacin In D5w]     Unclear reaction in past    Social History   Socioeconomic History  . Marital status: Married    Spouse name: Legrand Como   . Number of children: 4  . Years of education: 12+  . Highest education level: Not on file  Social Needs  . Financial resource strain: Not on file  . Food insecurity - worry: Not on file  . Food insecurity - inability:  Not on file  . Transportation needs - medical: Not on file  . Transportation needs - non-medical: Not on file  Occupational History  . Occupation: Ship broker   Tobacco Use  . Smoking status: Current Some Day Smoker    Packs/day: 0.50    Types: Cigarettes  . Smokeless tobacco: Never Used  Substance and Sexual Activity  . Alcohol use: Yes    Alcohol/week: 0.0 oz    Comment: rare -ocassionally  . Drug use: No  . Sexual activity: Yes    Birth control/protection: Surgical    Comment: 1st intercourse 47 yo-More than 5 partners-BTL  Other Topics Concern  . Not on file  Social History Narrative   Patient lives at home with husband Legrand Como    Patient has 4 children    Patient has 12+ years of education.    Patient is right handed.             Review of Systems  All other systems reviewed and are negative.      Objective:   Physical Exam  Constitutional: She appears well-developed and well-nourished.  HENT:  Right Ear: External ear normal.  Left Ear: External ear normal.  Nose: Mucosal edema and rhinorrhea present. Right sinus exhibits maxillary sinus tenderness. Left sinus exhibits maxillary sinus tenderness.  Mouth/Throat: Oropharynx is clear and moist. No oropharyngeal  exudate.  Cardiovascular: Normal rate and regular rhythm.  Pulmonary/Chest: Effort normal and breath sounds normal.  Vitals reviewed.         Assessment & Plan:  Acute bacterial rhinosinusitis - Plan: amoxicillin-clavulanate (AUGMENTIN) 875-125 MG tablet, fluconazole (DIFLUCAN) 150 MG tablet  Acute maxillary sinusitis, recurrence not specified  I will treat the patient with Augmentin 875 mg by mouth twice a day for 14 days.

## 2018-04-20 ENCOUNTER — Other Ambulatory Visit: Payer: Self-pay | Admitting: *Deleted

## 2018-04-20 MED ORDER — FLUTICASONE PROPIONATE 50 MCG/ACT NA SUSP: NASAL | 3 refills | Status: DC

## 2018-06-04 ENCOUNTER — Other Ambulatory Visit: Payer: Self-pay | Admitting: Family Medicine

## 2018-06-04 MED ORDER — LEVOCETIRIZINE DIHYDROCHLORIDE 5 MG PO TABS: 5.0000 mg | ORAL_TABLET | Freq: Every day | ORAL | 3 refills | Status: DC

## 2018-10-27 ENCOUNTER — Ambulatory Visit (INDEPENDENT_AMBULATORY_CARE_PROVIDER_SITE_OTHER): Admitting: Family Medicine

## 2018-10-27 ENCOUNTER — Encounter: Payer: Self-pay | Admitting: Family Medicine

## 2018-10-27 VITALS — BP 128/86 | HR 92 | Temp 97.6°F | Resp 16 | Wt 138.2 lb

## 2018-10-27 DIAGNOSIS — J324 Chronic pansinusitis: Secondary | ICD-10-CM | POA: Diagnosis not present

## 2018-10-27 DIAGNOSIS — J3089 Other allergic rhinitis: Secondary | ICD-10-CM | POA: Diagnosis not present

## 2018-10-27 MED ORDER — MONTELUKAST SODIUM 10 MG PO TABS: 10.0000 mg | ORAL_TABLET | Freq: Every day | ORAL | 3 refills | Status: DC

## 2018-10-27 MED ORDER — CROMOLYN SODIUM 4 % OP SOLN: 1.0000 [drp] | Freq: Four times a day (QID) | OPHTHALMIC | 12 refills | Status: DC | PRN

## 2018-10-27 MED ORDER — FLUTICASONE PROPIONATE 50 MCG/ACT NA SUSP: NASAL | 3 refills | Status: DC

## 2018-10-27 MED ORDER — AMOXICILLIN-POT CLAVULANATE 875-125 MG PO TABS: 1.0000 | ORAL_TABLET | Freq: Two times a day (BID) | ORAL | 0 refills | Status: AC

## 2018-10-27 NOTE — Progress Notes (Signed)
Patient ID: April Mcmillan, female    DOB: 09-17-1971, 47 y.o.   MRN: [id number]  PCP: Susy Frizzle, MD  Chief Complaint  Patient presents with  . Sinusitis    sinus pressure, post nasal drip, sore throat. onset 4 weeks ago    Subjective:   April Mcmillan is a 47 y.o. female, presents to clinic with CC of 4 weeks of sinus pain, pressure and post nasal drip with mild scratchy throat.  She has hx of recurrent and frequent sinusitis.  Her sx have gradually worsened over the past 4 weeks.  About a week ago she had severe worsening of pain, located to bilateral maxillary sinus and behind eyes and this gradually improved in the last 2 days.  She is on xyzal and has been taking over the counter medicine without much improvement.  No fever, chills, sweats, HA's.  She requests refills of eye drops but cannot remember what they are called, but also wants a cheaper version of the eye drops.  She has itchy eyes sometimes with her seasonal allergies.  She denies cough, CP, SOB, wheeze, rash, N, V, D.   Patient Active Problem List   Diagnosis Date Noted  . Recurrent sinus infections 03/27/2014  . Sinusitis, acute 09/07/2013  . Seasonal allergies      Prior to Admission medications   Medication Sig Start Date End Date Taking? Authorizing Provider  albuterol (PROVENTIL HFA;VENTOLIN HFA) 108 (90 Base) MCG/ACT inhaler Inhale 2 puffs into the lungs every 4 (four) hours as needed for wheezing or shortness of breath. Patient not taking: Reported on 10/27/2018 12/14/15   Alycia Rossetti, MD  cyclobenzaprine (FLEXERIL) 10 MG tablet Take 1 tablet (10 mg total) by mouth 3 (three) times daily as needed for muscle spasms. Patient not taking: Reported on 11/04/2017 06/17/17   Dena Billet B, PA-C  fluticasone [facility]) 50 MCG/ACT nasal spray INSTILL 2 SPRAYS IN [facility] NOSTRIL AS NEEDED Patient not taking: Reported on 10/27/2018 04/20/18   Alycia Rossetti, MD  ipratropium (ATROVENT) 0.03 % nasal spray Place 2  sprays into both nostrils every 12 (twelve) hours. Patient not taking: Reported on 10/27/2018 11/04/17   Alycia Rossetti, MD  levocetirizine (XYZAL) 5 MG tablet Take 1 tablet (5 mg total) by mouth daily. Patient not taking: Reported on 10/27/2018 06/04/18   Susy Frizzle, MD  olopatadine (PATANOL) 0.1 % ophthalmic solution Place 1 drop into both eyes 2 (two) times daily as needed for allergies. Patient not taking: Reported on 10/27/2018 03/17/17   Alycia Rossetti, MD  Probiotic Product (ALIGN) 4 MG CAPS 1 capsule daily Patient not taking: Reported on 10/27/2018 03/09/17   Alycia Rossetti, MD     Allergies  Allergen Reactions  . Levaquin [Levofloxacin In D5w]     Unclear reaction in past      Family History  Problem Relation Age of Onset  . Hypertension Mother   . Diabetes Father   . Heart disease Father   . Breast cancer Sister 53     Social History   Socioeconomic History  . Marital status: Married    Spouse name: Legrand Como   . Number of children: 4  . Years of education: 12+  . Highest education level: Not on file  Occupational History  . Occupation: Ship broker   Social Needs  . Financial resource strain: Not on file  . Food insecurity:    Worry: Not on file    Inability: Not on file  .  Transportation needs:    Medical: Not on file    Non-medical: Not on file  Tobacco Use  . Smoking status: Current Some Day Smoker    Packs/day: 0.50    Types: Cigarettes  . Smokeless tobacco: Never Used  Substance and Sexual Activity  . Alcohol use: Yes    Alcohol/week: 0.0 standard drinks    Comment: rare -ocassionally  . Drug use: No  . Sexual activity: Yes    Birth control/protection: Surgical    Comment: 1st intercourse 47 yo-More than 5 partners-BTL  Lifestyle  . Physical activity:    Days per week: Not on file    Minutes per session: Not on file  . Stress: Not on file  Relationships  . Social connections:    Talks on phone: Not on file    Gets together: Not on  file    Attends religious service: Not on file    Active member of club or organization: Not on file    Attends meetings of clubs or organizations: Not on file    Relationship status: Not on file  . Intimate partner violence:    Fear of current or ex partner: Not on file    Emotionally abused: Not on file    Physically abused: Not on file    Forced sexual activity: Not on file  Other Topics Concern  . Not on file  Social History Narrative   Patient lives at home with husband Legrand Como    Patient has 4 children    Patient has 12+ years of education.    Patient is right handed.            Review of Systems  Constitutional: Negative.   HENT: Negative.   Eyes: Negative.   Respiratory: Negative.   Cardiovascular: Negative.   Gastrointestinal: Negative.   Endocrine: Negative.   Genitourinary: Negative.   Musculoskeletal: Negative.   Skin: Negative.   Allergic/Immunologic: Negative.   Neurological: Negative.   Hematological: Negative.   Psychiatric/Behavioral: Negative.   All other systems reviewed and are negative.      Objective:    Vitals:   10/27/18 1431  BP: 128/86  Pulse: 92  Resp: 16  Temp: 97.6 F (36.4 C)  TempSrc: Oral  SpO2: 96%  Weight: 138 lb 4 oz (62.7 kg)      Physical Exam  Constitutional: She appears well-developed and well-nourished.  Non-toxic appearance. She does not have a sickly appearance. She does not appear ill. No distress.  HENT:  Head: Normocephalic and atraumatic.  Right Ear: Hearing, tympanic membrane, external ear and ear canal normal.  Left Ear: Hearing, tympanic membrane, external ear and ear canal normal.  Nose: Mucosal edema and rhinorrhea present. Right sinus exhibits no maxillary sinus tenderness and no frontal sinus tenderness. Left sinus exhibits no maxillary sinus tenderness and no frontal sinus tenderness.  Mouth/Throat: Uvula is midline, oropharynx is clear and moist and mucous membranes are normal. Mucous membranes are  not pale. No uvula swelling. No oropharyngeal exudate or tonsillar abscesses.  Eyes: Pupils are equal, round, and reactive to light. Conjunctivae and EOM are normal. Right eye exhibits no discharge. Left eye exhibits no discharge.  Neck: Normal range of motion. Neck supple. No tracheal deviation present.  Cardiovascular: Normal rate, regular rhythm, normal heart sounds and intact distal pulses.  Pulmonary/Chest: Effort normal. No stridor. No respiratory distress. She has no wheezes. She has no rhonchi. She has no rales.  Abdominal: Soft. Bowel sounds are normal. She exhibits no  distension.  Musculoskeletal: Normal range of motion.  Lymphadenopathy:    She has no cervical adenopathy.  Neurological: She is alert. She exhibits normal muscle tone. Coordination normal.  Skin: Skin is warm and dry. No rash noted. She is not diaphoretic. No pallor.  Psychiatric: She has a normal mood and affect. Her behavior is normal.  Nursing note and vitals reviewed.         Assessment & Plan:    47 y/o female with recurrent sinus congestion and seasonal allergies, presents with 4 weeks of worsening sx, a few days ago was severe and now slightly better.  VSS.  Feel she could increase allergy control and see if she continues to improve.  Augmentin given to hold and use if recurrent severe pain and fever.  She asks for affordable eye drops but is not sure what kind.  For allergies I suggested she try the allergy meds first and can try cromolyn drops if eyes still are itchy/watery.     ICD-10-CM   1. Pansinusitis, unspecified chronicity J32.4 montelukast (SINGULAIR) 10 MG tablet    fluticasone (FLONASE) 50 MCG/ACT nasal spray    amoxicillin-clavulanate (AUGMENTIN) 875-125 MG tablet  2. Environmental and seasonal allergies J30.89 montelukast (SINGULAIR) 10 MG tablet    fluticasone (FLONASE) 50 MCG/ACT nasal spray    cromolyn (OPTICROM) 4 % ophthalmic solution     She later showed me some NSAID drops that  she wanted refilled, however I explained that I did not feel they were indicated for her current sx for this OV, and she had no signs of conjunctivitis, and otherwise no eye pain, redness/irritation or inflammation.   Delsa Grana, PA-C 10/27/18 2:42 PM

## 2018-10-27 NOTE — Patient Instructions (Addendum)
Do your Xyzal, continue flonase and add montelukast daily  Hold antibiotic and start if you have worsening again and new fever  If you start the antibiotics you need to stop the sudafed

## 2018-11-09 ENCOUNTER — Ambulatory Visit: Admitting: Family Medicine

## 2019-06-05 ENCOUNTER — Other Ambulatory Visit: Payer: Self-pay | Admitting: Family Medicine

## 2019-07-04 ENCOUNTER — Ambulatory Visit (INDEPENDENT_AMBULATORY_CARE_PROVIDER_SITE_OTHER): Admitting: Family Medicine

## 2019-07-04 ENCOUNTER — Other Ambulatory Visit: Payer: Self-pay

## 2019-07-04 DIAGNOSIS — R6889 Other general symptoms and signs: Secondary | ICD-10-CM

## 2019-07-04 MED ORDER — OLOPATADINE HCL 0.1 % OP SOLN: 1.0000 [drp] | Freq: Two times a day (BID) | OPHTHALMIC | 12 refills | Status: DC | PRN

## 2019-07-04 MED ORDER — CLONAZEPAM 0.5 MG PO TABS: 0.5000 mg | ORAL_TABLET | Freq: Three times a day (TID) | ORAL | 1 refills | Status: DC | PRN

## 2019-07-04 NOTE — Progress Notes (Signed)
Subjective:    Patient ID: April Mcmillan, female    DOB: [birth year], 48 y.o.   MRN: [id number]  HPI Patient is being seen today as a telephone visit.  Phone call began at 156.  Phone call concluded at 217.  Patient began by describing Wednesday.  She developed cramps in her lower pelvic area as well as in her back.  She then developed a migraine which was located behind both of her eyes and in both of her cheeks.  By Friday the migraine had subsided but she was in a daze.  Sunday she developed uterine cramps and nausea again.  Today she is feeling better.  She denies any fevers or chills or coughing.  However she states that these were not her main reason for calling.  She states that for the last 4 years she is got an episode of numbness in her tongue.  This is usually associated with numbness in her left arm and left wrist.  She will describe a pain or pressure-like sensation in her neck.  She also describes a pressure-like sensation in her lower back.  Symptoms will build and trigger anxiety.  This usually triggers her migraines.  Symptoms have been getting increasingly worse over the last few months but have been present now for several years.  Episodes come and go without warning. Past Medical History:  Diagnosis Date  . Allergy    Past Surgical History:  Procedure Laterality Date  . TONSILLECTOMY    . TUBAL LIGATION     Current Outpatient Medications on File Prior to Visit  Medication Sig Dispense Refill  . albuterol (PROVENTIL HFA;VENTOLIN HFA) 108 (90 Base) MCG/ACT inhaler Inhale 2 puffs into the lungs every 4 (four) hours as needed for wheezing or shortness of breath. (Patient not taking: Reported on 10/27/2018) 1 Inhaler 1  . cromolyn (OPTICROM) 4 % ophthalmic solution Place 1 drop into both eyes 4 (four) times daily as needed. 10 mL 12  . cyclobenzaprine (FLEXERIL) 10 MG tablet Take 1 tablet (10 mg total) by mouth 3 (three) times daily as needed for muscle spasms. (Patient not taking:  Reported on 11/04/2017) 30 tablet 0  . fluticasone (FLONASE) 50 MCG/ACT nasal spray INSTILL 2 SPRAYS IN EACH NOSTRIL AS NEEDED 48 g 3  . ipratropium (ATROVENT) 0.03 % nasal spray Place 2 sprays into both nostrils every 12 (twelve) hours. (Patient not taking: Reported on 10/27/2018) 30 mL 12  . levocetirizine (XYZAL) 5 MG tablet TAKE 1 TABLET(5 MG) BY MOUTH DAILY 90 tablet 3  . montelukast (SINGULAIR) 10 MG tablet Take 1 tablet (10 mg total) by mouth at bedtime. 30 tablet 3  . Probiotic Product (ALIGN) 4 MG CAPS 1 capsule daily (Patient not taking: Reported on 10/27/2018) 30 capsule 2   No current facility-administered medications on file prior to visit.    Allergies  Allergen Reactions  . Levaquin [Levofloxacin In D5w]     Unclear reaction in past    Social History   Socioeconomic History  . Marital status: Married    Spouse name: Legrand Como   . Number of children: 4  . Years of education: 12+  . Highest education level: Not on file  Occupational History  . Occupation: Ship broker   Social Needs  . Financial resource strain: Not on file  . Food insecurity    Worry: Not on file    Inability: Not on file  . Transportation needs    Medical: Not on file    Non-medical: Not  on file  Tobacco Use  . Smoking status: Current Some Day Smoker    Packs/day: 0.50    Types: Cigarettes  . Smokeless tobacco: Never Used  Substance and Sexual Activity  . Alcohol use: Yes    Alcohol/week: 0.0 standard drinks    Comment: rare -ocassionally  . Drug use: No  . Sexual activity: Yes    Birth control/protection: Surgical    Comment: 1st intercourse 48 yo-More than 5 partners-BTL  Lifestyle  . Physical activity    Days per week: Not on file    Minutes per session: Not on file  . Stress: Not on file  Relationships  . Social Herbalist on phone: Not on file    Gets together: Not on file    Attends religious service: Not on file    Active member of club or organization: Not on file     Attends meetings of clubs or organizations: Not on file    Relationship status: Not on file  . Intimate partner violence    Fear of current or ex partner: Not on file    Emotionally abused: Not on file    Physically abused: Not on file    Forced sexual activity: Not on file  Other Topics Concern  . Not on file  Social History Narrative   Patient lives at home with husband Legrand Como    Patient has 4 children    Patient has 12+ years of education.    Patient is right handed.             Review of Systems  All other systems reviewed and are negative.      Objective:   Physical Exam   Physical exam could not be performed as the patient was seen today as a telephone visit.     Assessment & Plan:  Multiple complaints  The episodic numbness of the tongue, episodic numbness and weakness of her left arm off and on for 4 years raise suspicion about possible MS.  She occasionally does report diplopia.  I would like to see the patient on Friday to perform a thorough neurologic exam and if abnormalities are present such as internuclear ophthalmoplegia, I would schedule the patient for an MRI of the brain, cervical spine etc. as well as a neurology consultation.  If there is numbness in her left wrist she may need nerve conduction studies of the upper extremities.  However I believe some of the symptoms could be exacerbated by anxiety and may even represent a panic attack with hyperventilation.  Therefore I would like the patient to try Klonopin 0.5 mg p.o. every 8 hours as needed symptoms over the next 2 to 3 days and then recheck on Friday to see if any of her symptoms improved to see if some of these complaints may be psychosomatic in nature.  I explained this in detail to the patient and she is comfortable with this plan.

## 2019-07-08 ENCOUNTER — Ambulatory Visit (INDEPENDENT_AMBULATORY_CARE_PROVIDER_SITE_OTHER): Admitting: Family Medicine

## 2019-07-08 ENCOUNTER — Other Ambulatory Visit: Payer: Self-pay

## 2019-07-08 ENCOUNTER — Encounter: Payer: Self-pay | Admitting: Family Medicine

## 2019-07-08 VITALS — BP 118/68 | HR 96 | Temp 98.3°F | Resp 14 | Ht 61.0 in | Wt 139.0 lb

## 2019-07-08 DIAGNOSIS — F419 Anxiety disorder, unspecified: Secondary | ICD-10-CM

## 2019-07-08 DIAGNOSIS — G8929 Other chronic pain: Secondary | ICD-10-CM

## 2019-07-08 DIAGNOSIS — R002 Palpitations: Secondary | ICD-10-CM

## 2019-07-08 DIAGNOSIS — R200 Anesthesia of skin: Secondary | ICD-10-CM

## 2019-07-08 DIAGNOSIS — M545 Low back pain, unspecified: Secondary | ICD-10-CM

## 2019-07-08 MED ORDER — VENLAFAXINE HCL ER 75 MG PO CP24: 150.0000 mg | ORAL_CAPSULE | Freq: Every day | ORAL | 3 refills | Status: DC

## 2019-07-08 NOTE — Progress Notes (Signed)
Subjective:    Patient ID: April Mcmillan, female    DOB: January 26, 1971, 48 y.o.   MRN: [id number]  HPI  07/04/19 Patient is being seen today as a telephone visit.  Phone call began at 156.  Phone call concluded at 217.  Patient began by describing Wednesday.  She developed cramps in her lower pelvic area as well as in her back.  She then developed a migraine which was located behind both of her eyes and in both of her cheeks.  By Friday the migraine had subsided but she was in a daze.  Sunday she developed uterine cramps and nausea again.  Today she is feeling better.  She denies any fevers or chills or coughing.  However she states that these were not her main reason for calling.  She states that for the last 4 years she is got an episode of numbness in her tongue.  This is usually associated with numbness in her left arm and left wrist.  She will describe a pain or pressure-like sensation in her neck.  She also describes a pressure-like sensation in her lower back.  Symptoms will build and trigger anxiety.  This usually triggers her migraines.  Symptoms have been getting increasingly worse over the last few months but have been present now for several years.  Episodes come and go without warning.  At that time, my plan was:  The episodic numbness of the tongue, episodic numbness and weakness of her left arm off and on for 4 years raise suspicion about possible MS.  She occasionally does report diplopia.  I would like to see the patient on Friday to perform a thorough neurologic exam and if abnormalities are present such as internuclear ophthalmoplegia, I would schedule the patient for an MRI of the brain, cervical spine etc. as well as a neurology consultation.  If there is numbness in her left wrist she may need nerve conduction studies of the upper extremities.  However I believe some of the symptoms could be exacerbated by anxiety and may even represent a panic attack with hyperventilation.  Therefore I  would like the patient to try Klonopin 0.5 mg p.o. every 8 hours as needed symptoms over the next 2 to 3 days and then recheck on Friday to see if any of her symptoms improved to see if some of these complaints may be psychosomatic in nature.  I explained this in detail to the patient and she is comfortable with this plan.  07/08/19 Patient is here today for follow-up.  She had one episode where her tongue started going numb.  This occurred last night.  She woke up from a dead sleep.  Her tongue was numb.  Her heart started racing.  She took 1 of the Klonopin's that I prescribed for her.  The symptoms resolved quickly and she went back to sleep.  However that is the only time she is used it in the last week.  Continues to have several somatic complaints.  First she reports frequent episodes of her tongue burning or going numb.  This tends to occur at night while she sleeping.  It usually triggers palpitations in her chest similar to a panic attack.  However she does occasionally have it during the daytime.  She also reports episodes where her heart will start racing at night for no reason.  This will often awaken her.  She also complains of numbness and tingling in her left arm.  Distal to her elbow, she states  that her arm will become numb and feel weak.  She also has chronic low back pain.  She reports chronic neck pain.  She reports occasional numbness and tingling radiating down her legs.  Neurologic exam is performed today.  Pupils are equal round reactive to light bilaterally.  Extraocular motion is intact in both eyes.  There is no papilledema on funduscopic exam.  Romberg testing is completely normal.  Finger-to-nose testing is normal.  Heel-to-toe walk test is normal.  Cranial nerves II through XII are grossly intact with muscle strength 5/5 equal and symmetric in the upper and lower extremities.  She has normal reflexes and normal sensation. Past Medical History:  Diagnosis Date   Allergy    Past  Surgical History:  Procedure Laterality Date   TONSILLECTOMY     TUBAL LIGATION     Current Outpatient Medications on File Prior to Visit  Medication Sig Dispense Refill   albuterol (PROVENTIL HFA;VENTOLIN HFA) 108 (90 Base) MCG/ACT inhaler Inhale 2 puffs into the lungs every 4 (four) hours as needed for wheezing or shortness of breath. (Patient not taking: Reported on 10/27/2018) 1 Inhaler 1   clonazePAM (KLONOPIN) 0.5 MG tablet Take 1 tablet (0.5 mg total) by mouth 3 (three) times daily as needed for anxiety. 30 tablet 1   cromolyn (OPTICROM) 4 % ophthalmic solution Place 1 drop into both eyes 4 (four) times daily as needed. 10 mL 12   cyclobenzaprine (FLEXERIL) 10 MG tablet Take 1 tablet (10 mg total) by mouth 3 (three) times daily as needed for muscle spasms. (Patient not taking: Reported on 11/04/2017) 30 tablet 0   fluticasone (FLONASE) 50 MCG/ACT nasal spray INSTILL 2 SPRAYS IN EACH NOSTRIL AS NEEDED 48 g 3   ipratropium (ATROVENT) 0.03 % nasal spray Place 2 sprays into both nostrils every 12 (twelve) hours. (Patient not taking: Reported on 10/27/2018) 30 mL 12   levocetirizine (XYZAL) 5 MG tablet TAKE 1 TABLET(5 MG) BY MOUTH DAILY 90 tablet 3   montelukast (SINGULAIR) 10 MG tablet Take 1 tablet (10 mg total) by mouth at bedtime. 30 tablet 3   olopatadine (PATANOL) 0.1 % ophthalmic solution Place 1 drop into both eyes 2 (two) times daily as needed for allergies. 5 mL 12   Probiotic Product (ALIGN) 4 MG CAPS 1 capsule daily (Patient not taking: Reported on 10/27/2018) 30 capsule 2   No current facility-administered medications on file prior to visit.    Allergies  Allergen Reactions   Levaquin [Levofloxacin In D5w]     Unclear reaction in past    Social History   Socioeconomic History   Marital status: Married    Spouse name: Legrand Como    Number of children: 4   Years of education: 12+   Highest education level: Not on file  Occupational History   Occupation:  Environmental education officer strain: Not on file   Food insecurity    Worry: Not on file    Inability: Not on file   Transportation needs    Medical: Not on file    Non-medical: Not on file  Tobacco Use   Smoking status: Current Some Day Smoker    Packs/day: 0.50    Types: Cigarettes   Smokeless tobacco: Never Used  Substance and Sexual Activity   Alcohol use: Yes    Alcohol/week: 0.0 standard drinks    Comment: rare -ocassionally   Drug use: No   Sexual activity: Yes    Birth control/protection:  Surgical    Comment: 1st intercourse 48 yo-More than 5 partners-BTL  Lifestyle   Physical activity    Days per week: Not on file    Minutes per session: Not on file   Stress: Not on file  Relationships   Social connections    Talks on phone: Not on file    Gets together: Not on file    Attends religious service: Not on file    Active member of club or organization: Not on file    Attends meetings of clubs or organizations: Not on file    Relationship status: Not on file   Intimate partner violence    Fear of current or ex partner: Not on file    Emotionally abused: Not on file    Physically abused: Not on file    Forced sexual activity: Not on file  Other Topics Concern   Not on file  Social History Narrative   Patient lives at home with husband Legrand Como    Patient has 4 children    Patient has 12+ years of education.    Patient is right handed.             Review of Systems  All other systems reviewed and are negative.      Objective:   Physical Exam Vitals signs reviewed.  Constitutional:      General: She is not in acute distress.    Appearance: Normal appearance. She is normal weight. She is not ill-appearing or toxic-appearing.  HENT:     Mouth/Throat:     Mouth: Mucous membranes are moist.     Pharynx: Oropharynx is clear. No oropharyngeal exudate or posterior oropharyngeal erythema.  Eyes:     Extraocular Movements:  Extraocular movements intact.     Pupils: Pupils are equal, round, and reactive to light.  Neck:     Musculoskeletal: Normal range of motion and neck supple. No neck rigidity.  Cardiovascular:     Rate and Rhythm: Normal rate and regular rhythm.     Pulses: Normal pulses.     Heart sounds: Normal heart sounds. No murmur.  Pulmonary:     Effort: Pulmonary effort is normal. No respiratory distress.     Breath sounds: Normal breath sounds. No stridor. No wheezing, rhonchi or rales.  Lymphadenopathy:     Cervical: No cervical adenopathy.  Neurological:     General: No focal deficit present.     Mental Status: She is alert and oriented to person, place, and time. Mental status is at baseline.     Cranial Nerves: No cranial nerve deficit.     Sensory: No sensory deficit.     Motor: No weakness.     Coordination: Coordination normal.     Gait: Gait normal.     Deep Tendon Reflexes: Reflexes normal.    +   Assessment & Plan:  1. Anxiety   2. Palpitation   3. Left arm numbness   4. Chronic midline low back pain without sciatica  5.  Tongue numbness  Spent more than 30 minutes today with the patient discussing her medical conditions and performing an exam.  I believe the vast majority of her symptoms could be related to anxiety.  I question if she may be having a panic attack causing her heart to race at night.  I question if hyperventilation and anxiety could be causing numbness in her tongue or if the patient could be experiencing burning mouth syndrome.  Patient states that this  is gone on for the last 4 years however it has recently gotten worse.  Also the differential diagnosis would be MS particular given the numbness and tingling in multiple parts of her body separated by time and space.  She also reports occasional weakness in her left arm.  Therefore we discussed whether we should proceed with an MRI of the brain, cervical spine, and lumbar spine as well as nerve conduction  studies of the left arm.  I have recommended trying an anxiety medication first given the chronicity of her symptoms and see as the dust clears how many of her symptoms remain.  I recommended starting Effexor XR 75 mg p.o. every morning and then increasing to 150 mg p.o. every morning in 1 week.  She can use Klonopin 0.5 mg p.o. nightly prior to bed for the first 2 weeks and then stop the medication.  I would like to see the patient back in 4 weeks to see how she is doing.  If symptoms are not improved at that point I would recommend further diagnostic work-up including an MRI of the brain, cervical spine as well as nerve conduction studies of the upper extremities.

## 2019-07-14 ENCOUNTER — Telehealth: Payer: Self-pay | Admitting: Family Medicine

## 2019-07-14 NOTE — Telephone Encounter (Signed)
Patient is calling to say that the medication that was prescribed to her is giving her some side effects  Would like to speak to you regarding this   [phone]

## 2019-07-15 NOTE — Telephone Encounter (Signed)
Spoke to pt and she is unable to take Effexor as it caused her Nausea, HA, Fatigue and unable to function. She is ok with just taking the Klonopin or trying something else. She states that she can try the Klonopin for 2 weeks and stop it to see how she is doing after that or she is willing to try something else. Whatever you recommend she is ok with.

## 2019-07-18 ENCOUNTER — Other Ambulatory Visit: Payer: Self-pay

## 2019-07-18 MED ORDER — ESCITALOPRAM OXALATE 10 MG PO TABS: 10.0000 mg | ORAL_TABLET | Freq: Every day | ORAL | 1 refills | Status: DC

## 2019-07-18 NOTE — Telephone Encounter (Signed)
Lets try lexapro 10 mg poqday in place of effexor.

## 2019-07-18 NOTE — Telephone Encounter (Signed)
Pt notified. Verbalizes understanding. Rx sent to pharmacy.

## 2019-08-09 ENCOUNTER — Ambulatory Visit (INDEPENDENT_AMBULATORY_CARE_PROVIDER_SITE_OTHER): Admitting: Family Medicine

## 2019-08-09 ENCOUNTER — Other Ambulatory Visit: Payer: Self-pay

## 2019-08-09 VITALS — BP 128/76 | HR 78 | Temp 97.9°F | Resp 14 | Ht 61.0 in | Wt 141.0 lb

## 2019-08-09 DIAGNOSIS — F411 Generalized anxiety disorder: Secondary | ICD-10-CM

## 2019-08-09 NOTE — Progress Notes (Signed)
Subjective:    Patient ID: April Mcmillan, female    DOB: January 26, 1971, 48 y.o.   MRN: [id number]  HPI  07/04/19 Patient is being seen today as a telephone visit.  Phone call began at 156.  Phone call concluded at 217.  Patient began by describing Wednesday.  She developed cramps in her lower pelvic area as well as in her back.  She then developed a migraine which was located behind both of her eyes and in both of her cheeks.  By Friday the migraine had subsided but she was in a daze.  Sunday she developed uterine cramps and nausea again.  Today she is feeling better.  She denies any fevers or chills or coughing.  However she states that these were not her main reason for calling.  She states that for the last 4 years she is got an episode of numbness in her tongue.  This is usually associated with numbness in her left arm and left wrist.  She will describe a pain or pressure-like sensation in her neck.  She also describes a pressure-like sensation in her lower back.  Symptoms will build and trigger anxiety.  This usually triggers her migraines.  Symptoms have been getting increasingly worse over the last few months but have been present now for several years.  Episodes come and go without warning.  At that time, my plan was:  The episodic numbness of the tongue, episodic numbness and weakness of her left arm off and on for 4 years raise suspicion about possible MS.  She occasionally does report diplopia.  I would like to see the patient on Friday to perform a thorough neurologic exam and if abnormalities are present such as internuclear ophthalmoplegia, I would schedule the patient for an MRI of the brain, cervical spine etc. as well as a neurology consultation.  If there is numbness in her left wrist she may need nerve conduction studies of the upper extremities.  However I believe some of the symptoms could be exacerbated by anxiety and may even represent a panic attack with hyperventilation.  Therefore I  would like the patient to try Klonopin 0.5 mg p.o. every 8 hours as needed symptoms over the next 2 to 3 days and then recheck on Friday to see if any of her symptoms improved to see if some of these complaints may be psychosomatic in nature.  I explained this in detail to the patient and she is comfortable with this plan.  07/08/19 Patient is here today for follow-up.  She had one episode where her tongue started going numb.  This occurred last night.  She woke up from a dead sleep.  Her tongue was numb.  Her heart started racing.  She took 1 of the Klonopin's that I prescribed for her.  The symptoms resolved quickly and she went back to sleep.  However that is the only time she is used it in the last week.  Continues to have several somatic complaints.  First she reports frequent episodes of her tongue burning or going numb.  This tends to occur at night while she sleeping.  It usually triggers palpitations in her chest similar to a panic attack.  However she does occasionally have it during the daytime.  She also reports episodes where her heart will start racing at night for no reason.  This will often awaken her.  She also complains of numbness and tingling in her left arm.  Distal to her elbow, she states  that her arm will become numb and feel weak.  She also has chronic low back pain.  She reports chronic neck pain.  She reports occasional numbness and tingling radiating down her legs.  Neurologic exam is performed today.  Pupils are equal round reactive to light bilaterally.  Extraocular motion is intact in both eyes.  There is no papilledema on funduscopic exam.  Romberg testing is completely normal.  Finger-to-nose testing is normal.  Heel-to-toe walk test is normal.  Cranial nerves II through XII are grossly intact with muscle strength 5/5 equal and symmetric in the upper and lower extremities.  She has normal reflexes and normal sensation. AT that time, my plan was: Spent more than 30 minutes today  with the patient discussing her medical conditions and performing an exam.  I believe the vast majority of her symptoms could be related to anxiety.  I question if she may be having a panic attack causing her heart to race at night.  I question if hyperventilation and anxiety could be causing numbness in her tongue or if the patient could be experiencing burning mouth syndrome.  Patient states that this is gone on for the last 4 years however it has recently gotten worse.  Also the differential diagnosis would be MS particular given the numbness and tingling in multiple parts of her body separated by time and space.  She also reports occasional weakness in her left arm.  Therefore we discussed whether we should proceed with an MRI of the brain, cervical spine, and lumbar spine as well as nerve conduction studies of the left arm.  I have recommended trying an anxiety medication first given the chronicity of her symptoms and see as the dust clears how many of her symptoms remain.  I recommended starting Effexor XR 75 mg p.o. every morning and then increasing to 150 mg p.o. every morning in 1 week.  She can use Klonopin 0.5 mg p.o. nightly prior to bed for the first 2 weeks and then stop the medication.  I would like to see the patient back in 4 weeks to see how she is doing.  If symptoms are not improved at that point I would recommend further diagnostic work-up including an MRI of the brain, cervical spine as well as nerve conduction studies of the upper extremities.  08/09/19 Patient initially tried Effexor.  However the Effexor caused nausea and made her feel "terrible".  Therefore we switched to Lexapro 10 mg a day.  She has been on Lexapro 10 mg a day now for approximately 3 to 4 weeks.  She states that she feels much better.  She has noticed improvement in her anxiety.  The frequency and intensity of panic attacks has improved.  She did try Klonopin for the first 2 weeks.  It did make her sleepy however it also  helped alleviate many of the panic attack she was having.  She did see improvement in the numbness in her face and tongue when she took Klonopin.  Therefore I suspect hyperventilation was causing paresthesias.  In the last 2 weeks she is only taken 1 Klonopin 3 days ago while trying to sleep.  Otherwise she is doing much better.  She to believes that she could be dealing with anxiety and if anxiety was likely causing many of her symptoms.  The only downside to the Lexapro so far has been the patient has noticed a slight increase in the frequency of her migraines. Past Medical History:  Diagnosis Date  Allergy    Past Surgical History:  Procedure Laterality Date   TONSILLECTOMY     TUBAL LIGATION     Current Outpatient Medications on File Prior to Visit  Medication Sig Dispense Refill   albuterol (PROVENTIL HFA;VENTOLIN HFA) 108 (90 Base) MCG/ACT inhaler Inhale 2 puffs into the lungs every 4 (four) hours as needed for wheezing or shortness of breath. 1 Inhaler 1   clonazePAM (KLONOPIN) 0.5 MG tablet Take 1 tablet (0.5 mg total) by mouth 3 (three) times daily as needed for anxiety. 30 tablet 1   escitalopram (LEXAPRO) 10 MG tablet Take 1 tablet (10 mg total) by mouth daily. 30 tablet 1   fluticasone (FLONASE) 50 MCG/ACT nasal spray INSTILL 2 SPRAYS IN EACH NOSTRIL AS NEEDED 48 g 3   levocetirizine (XYZAL) 5 MG tablet TAKE 1 TABLET(5 MG) BY MOUTH DAILY 90 tablet 3   olopatadine (PATANOL) 0.1 % ophthalmic solution Place 1 drop into both eyes 2 (two) times daily as needed for allergies. 5 mL 12   Probiotic Product (ALIGN) 4 MG CAPS 1 capsule daily (Patient taking differently: 1 capsule daily) 30 capsule 2   No current facility-administered medications on file prior to visit.    Allergies  Allergen Reactions   Levaquin [Levofloxacin In D5w]     Unclear reaction in past    Social History   Socioeconomic History   Marital status: Married    Spouse name: Legrand Como    Number of  children: 4   Years of education: 12+   Highest education level: Not on file  Occupational History   Occupation: Environmental education officer strain: Not on file   Food insecurity    Worry: Not on file    Inability: Not on file   Transportation needs    Medical: Not on file    Non-medical: Not on file  Tobacco Use   Smoking status: Current Some Day Smoker    Packs/day: 0.50    Types: Cigarettes   Smokeless tobacco: Never Used  Substance and Sexual Activity   Alcohol use: Yes    Alcohol/week: 0.0 standard drinks    Comment: rare -ocassionally   Drug use: No   Sexual activity: Yes    Birth control/protection: Surgical    Comment: 1st intercourse 48 yo-More than 5 partners-BTL  Lifestyle   Physical activity    Days per week: Not on file    Minutes per session: Not on file   Stress: Not on file  Relationships   Social connections    Talks on phone: Not on file    Gets together: Not on file    Attends religious service: Not on file    Active member of club or organization: Not on file    Attends meetings of clubs or organizations: Not on file    Relationship status: Not on file   Intimate partner violence    Fear of current or ex partner: Not on file    Emotionally abused: Not on file    Physically abused: Not on file    Forced sexual activity: Not on file  Other Topics Concern   Not on file  Social History Narrative   Patient lives at home with husband Legrand Como    Patient has 4 children    Patient has 12+ years of education.    Patient is right handed.             Review of Systems  All other  systems reviewed and are negative.      Objective:   Physical Exam Vitals signs reviewed.  Constitutional:      General: She is not in acute distress.    Appearance: Normal appearance. She is normal weight. She is not ill-appearing or toxic-appearing.  HENT:     Mouth/Throat:     Mouth: Mucous membranes are moist.     Pharynx:  Oropharynx is clear. No oropharyngeal exudate or posterior oropharyngeal erythema.  Eyes:     Extraocular Movements: Extraocular movements intact.     Pupils: Pupils are equal, round, and reactive to light.  Neck:     Musculoskeletal: Normal range of motion and neck supple. No neck rigidity.  Cardiovascular:     Rate and Rhythm: Normal rate and regular rhythm.     Pulses: Normal pulses.     Heart sounds: Normal heart sounds. No murmur.  Pulmonary:     Effort: Pulmonary effort is normal. No respiratory distress.     Breath sounds: Normal breath sounds. No stridor. No wheezing, rhonchi or rales.  Lymphadenopathy:     Cervical: No cervical adenopathy.  Neurological:     General: No focal deficit present.     Mental Status: She is alert and oriented to person, place, and time. Mental status is at baseline.     Cranial Nerves: No cranial nerve deficit.     Sensory: No sensory deficit.     Motor: No weakness.     Coordination: Coordination normal.     Gait: Gait normal.     Deep Tendon Reflexes: Reflexes normal.     Assessment & Plan:   GAD (generalized anxiety disorder) - Plan: COMPLETE METABOLIC PANEL WITH GFR, CBC with Differential/Platelet, TSH  I believe that anxiety is likely explaining her symptoms.  We will continue Lexapro 10 mg a day and then reassess over the next 3 to 4 weeks to see if the dose needs to be increased.  She will start to use the Klonopin only as needed for breakthrough panic attacks.  Patient is comfortable with this plan and declines referral to neurology at the present time.

## 2019-08-10 LAB — CBC WITH DIFFERENTIAL/PLATELET
Absolute Monocytes: 593 cells/uL (ref 200–950)
Basophils Absolute: 60 cells/uL (ref 0–200)
Basophils Relative: 0.8 %
Eosinophils Absolute: 38 cells/uL (ref 15–500)
Eosinophils Relative: 0.5 %
HCT: 40.8 % (ref 35.0–45.0)
Hemoglobin: 13.3 g/dL (ref 11.7–15.5)
Lymphs Abs: 2010 cells/uL (ref 850–3900)
MCH: 30.3 pg (ref 27.0–33.0)
MCHC: 32.6 g/dL (ref 32.0–36.0)
MCV: 92.9 fL (ref 80.0–100.0)
MPV: 11.2 fL (ref 7.5–12.5)
Monocytes Relative: 7.9 %
Neutro Abs: 4800 cells/uL (ref 1500–7800)
Neutrophils Relative %: 64 %
Platelets: 243 10*3/uL (ref 140–400)
RBC: 4.39 10*6/uL (ref 3.80–5.10)
RDW: 11.8 % (ref 11.0–15.0)
Total Lymphocyte: 26.8 %
WBC: 7.5 10*3/uL (ref 3.8–10.8)

## 2019-08-10 LAB — COMPLETE METABOLIC PANEL WITH GFR
AG Ratio: 1.8 (calc) (ref 1.0–2.5)
ALT: 13 U/L (ref 6–29)
AST: 13 U/L (ref 10–35)
Albumin: 4.2 g/dL (ref 3.6–5.1)
Alkaline phosphatase (APISO): 49 U/L (ref 31–125)
BUN: 9 mg/dL (ref 7–25)
CO2: 25 mmol/L (ref 20–32)
Calcium: 9.6 mg/dL (ref 8.6–10.2)
Chloride: 104 mmol/L (ref 98–110)
Creat: 0.71 mg/dL (ref 0.50–1.10)
GFR, Est African American: 116 mL/min/{1.73_m2} (ref >=60)
GFR, Est Non African American: 100 mL/min/{1.73_m2} (ref >=60)
Globulin: 2.3 g/dL (calc) (ref 1.9–3.7)
Glucose, Bld: 80 mg/dL (ref 65–99)
Potassium: 4.3 mmol/L (ref 3.5–5.3)
Sodium: 138 mmol/L (ref 135–146)
Total Bilirubin: 0.3 mg/dL (ref 0.2–1.2)
Total Protein: 6.5 g/dL (ref 6.1–8.1)

## 2019-08-10 LAB — TSH: TSH: 1.4 mIU/L

## 2019-08-31 ENCOUNTER — Encounter: Payer: Self-pay | Admitting: Gynecology

## 2019-09-07 ENCOUNTER — Other Ambulatory Visit: Payer: Self-pay | Admitting: Family Medicine

## 2019-09-30 ENCOUNTER — Ambulatory Visit

## 2019-12-16 ENCOUNTER — Other Ambulatory Visit: Payer: Self-pay

## 2019-12-16 DIAGNOSIS — J324 Chronic pansinusitis: Secondary | ICD-10-CM

## 2019-12-16 DIAGNOSIS — J3089 Other allergic rhinitis: Secondary | ICD-10-CM

## 2019-12-16 MED ORDER — FLUTICASONE PROPIONATE 50 MCG/ACT NA SUSP: NASAL | 1 refills | Status: DC

## 2020-06-06 ENCOUNTER — Other Ambulatory Visit: Payer: Self-pay | Admitting: Family Medicine

## 2020-09-06 ENCOUNTER — Other Ambulatory Visit: Payer: Self-pay | Admitting: Family Medicine

## 2020-10-10 ENCOUNTER — Other Ambulatory Visit: Payer: Self-pay | Admitting: Family Medicine

## 2021-01-03 ENCOUNTER — Other Ambulatory Visit: Payer: Self-pay | Admitting: Family Medicine

## 2021-01-03 DIAGNOSIS — J324 Chronic pansinusitis: Secondary | ICD-10-CM

## 2021-01-03 DIAGNOSIS — J3089 Other allergic rhinitis: Secondary | ICD-10-CM

## 2021-06-03 ENCOUNTER — Other Ambulatory Visit: Payer: Self-pay | Admitting: Family Medicine

## 2021-09-09 ENCOUNTER — Other Ambulatory Visit: Payer: Self-pay | Admitting: Family Medicine

## 2021-10-02 ENCOUNTER — Other Ambulatory Visit

## 2021-10-02 ENCOUNTER — Other Ambulatory Visit: Payer: Self-pay

## 2021-10-02 DIAGNOSIS — Z1159 Encounter for screening for other viral diseases: Secondary | ICD-10-CM

## 2021-10-02 DIAGNOSIS — Z13220 Encounter for screening for lipoid disorders: Secondary | ICD-10-CM

## 2021-10-03 ENCOUNTER — Ambulatory Visit: Admitting: Family Medicine

## 2021-10-03 LAB — COMPLETE METABOLIC PANEL WITH GFR
AG Ratio: 1.8 (calc) (ref 1.0–2.5)
ALT: 14 U/L (ref 6–29)
AST: 17 U/L (ref 10–35)
Albumin: 4.3 g/dL (ref 3.6–5.1)
Alkaline phosphatase (APISO): 59 U/L (ref 37–153)
BUN: 12 mg/dL (ref 7–25)
CO2: 23 mmol/L (ref 20–32)
Calcium: 9.3 mg/dL (ref 8.6–10.4)
Chloride: 104 mmol/L (ref 98–110)
Creat: 0.73 mg/dL (ref 0.50–1.03)
Globulin: 2.4 g/dL (calc) (ref 1.9–3.7)
Glucose, Bld: 81 mg/dL (ref 65–99)
Potassium: 4.3 mmol/L (ref 3.5–5.3)
Sodium: 140 mmol/L (ref 135–146)
Total Bilirubin: 0.4 mg/dL (ref 0.2–1.2)
Total Protein: 6.7 g/dL (ref 6.1–8.1)
eGFR: 99 mL/min/{1.73_m2} (ref >=60)

## 2021-10-03 LAB — CBC WITH DIFFERENTIAL/PLATELET
Absolute Monocytes: 486 cells/uL (ref 200–950)
Basophils Absolute: 60 cells/uL (ref 0–200)
Basophils Relative: 1 %
Eosinophils Absolute: 60 cells/uL (ref 15–500)
Eosinophils Relative: 1 %
HCT: 42.6 % (ref 35.0–45.0)
Hemoglobin: 14.2 g/dL (ref 11.7–15.5)
Lymphs Abs: 2358 cells/uL (ref 850–3900)
MCH: 30.9 pg (ref 27.0–33.0)
MCHC: 33.3 g/dL (ref 32.0–36.0)
MCV: 92.8 fL (ref 80.0–100.0)
MPV: 11.2 fL (ref 7.5–12.5)
Monocytes Relative: 8.1 %
Neutro Abs: 3036 cells/uL (ref 1500–7800)
Neutrophils Relative %: 50.6 %
Platelets: 239 10*3/uL (ref 140–400)
RBC: 4.59 10*6/uL (ref 3.80–5.10)
RDW: 11.6 % (ref 11.0–15.0)
Total Lymphocyte: 39.3 %
WBC: 6 10*3/uL (ref 3.8–10.8)

## 2021-10-03 LAB — LIPID PANEL
Cholesterol: 214 mg/dL — ABNORMAL HIGH (ref <200)
HDL: 79 mg/dL (ref >=50)
LDL Cholesterol (Calc): 119 mg/dL (calc) — ABNORMAL HIGH
Non-HDL Cholesterol (Calc): 135 mg/dL (calc) — ABNORMAL HIGH (ref <130)
Total CHOL/HDL Ratio: 2.7 (calc) (ref <5.0)
Triglycerides: 69 mg/dL (ref <150)

## 2021-10-03 LAB — HEPATITIS C ANTIBODY
Hepatitis C Ab: NONREACTIVE
SIGNAL TO CUT-OFF: 0.01 (ref <1.00)

## 2021-10-10 ENCOUNTER — Encounter: Payer: Self-pay | Admitting: Family Medicine

## 2021-10-10 ENCOUNTER — Ambulatory Visit (INDEPENDENT_AMBULATORY_CARE_PROVIDER_SITE_OTHER): Admitting: Family Medicine

## 2021-10-10 ENCOUNTER — Other Ambulatory Visit: Payer: Self-pay

## 2021-10-10 VITALS — BP 118/66 | HR 76 | Temp 98.3°F | Resp 16 | Ht 61.0 in | Wt 141.0 lb

## 2021-10-10 DIAGNOSIS — Z23 Encounter for immunization: Secondary | ICD-10-CM

## 2021-10-10 DIAGNOSIS — Z1211 Encounter for screening for malignant neoplasm of colon: Secondary | ICD-10-CM | POA: Diagnosis not present

## 2021-10-10 DIAGNOSIS — Z1231 Encounter for screening mammogram for malignant neoplasm of breast: Secondary | ICD-10-CM

## 2021-10-10 DIAGNOSIS — M5431 Sciatica, right side: Secondary | ICD-10-CM | POA: Diagnosis not present

## 2021-10-10 DIAGNOSIS — Z1212 Encounter for screening for malignant neoplasm of rectum: Secondary | ICD-10-CM | POA: Diagnosis not present

## 2021-10-10 MED ORDER — ESCITALOPRAM OXALATE 10 MG PO TABS: ORAL_TABLET | ORAL | 3 refills | Status: DC

## 2021-10-10 MED ORDER — CLONAZEPAM 0.5 MG PO TABS: 0.5000 mg | ORAL_TABLET | Freq: Three times a day (TID) | ORAL | 1 refills | Status: DC | PRN

## 2021-10-10 MED ORDER — OLOPATADINE HCL 0.1 % OP SOLN: 1.0000 [drp] | Freq: Two times a day (BID) | OPHTHALMIC | 12 refills | Status: AC | PRN

## 2021-10-10 NOTE — Progress Notes (Signed)
Subjective:    Patient ID: April Mcmillan, female    DOB: [birth year], 50 y.o.   MRN: [id number]  HPI  Patient is here today for a checkup.  She is due for a mammogram.  She will allow [place] to schedule this.  She is due for a Pap smear.  She prefers to schedule this with a female provider.  She is overdue for a colonoscopy.  After discussing the situation further she would like to try the Cologuard instead.  Her most recent lab work is listed below: Lab on 10/02/2021  Component Date Value Ref Range Status   WBC 10/02/2021 6.0  3.8 - 10.8 Thousand/uL Final   RBC 10/02/2021 4.59  3.80 - 5.10 Million/uL Final   Hemoglobin 10/02/2021 14.2  11.7 - 15.5 g/dL Final   HCT 14/76/0759 42.6  35.0 - 45.0 % Final   MCV 10/02/2021 92.8  80.0 - 100.0 fL Final   MCH 10/02/2021 30.9  27.0 - 33.0 pg Final   MCHC 10/02/2021 33.3  32.0 - 36.0 g/dL Final   RDW 78/96/4527 11.6  11.0 - 15.0 % Final   Platelets 10/02/2021 239  140 - 400 Thousand/uL Final   MPV 10/02/2021 11.2  7.5 - 12.5 fL Final   Neutro Abs 10/02/2021 3,036  1,500 - 7,800 cells/uL Final   Lymphs Abs 10/02/2021 2,358  850 - 3,900 cells/uL Final   Absolute Monocytes 10/02/2021 486  200 - 950 cells/uL Final   Eosinophils Absolute 10/02/2021 60  15 - 500 cells/uL Final   Basophils Absolute 10/02/2021 60  0 - 200 cells/uL Final   Neutrophils Relative % 10/02/2021 50.6  % Final   Total Lymphocyte 10/02/2021 39.3  % Final   Monocytes Relative 10/02/2021 8.1  % Final   Eosinophils Relative 10/02/2021 1.0  % Final   Basophils Relative 10/02/2021 1.0  % Final   Glucose, Bld 10/02/2021 81  65 - 99 mg/dL Final   Comment: .            Fasting reference interval .    BUN 10/02/2021 12  7 - 25 mg/dL Final   Creat 39/95/9161 0.73  0.50 - 1.03 mg/dL Final   eGFR 98/82/2088 99  > OR = 60 mL/min/1.83m2 Final   Comment: The eGFR is based on the CKD-EPI 2021 equation. To calculate  the new eGFR from a previous Creatinine or Cystatin C result, go to  https://www.kidney.org/professionals/ kdoqi/gfr%5Fcalculator    BUN/Creatinine Ratio 10/02/2021 NOT APPLICABLE  6 - 22 (calc) Final   Sodium 10/02/2021 140  135 - 146 mmol/L Final   Potassium 10/02/2021 4.3  3.5 - 5.3 mmol/L Final   Chloride 10/02/2021 104  98 - 110 mmol/L Final   CO2 10/02/2021 23  20 - 32 mmol/L Final   Calcium 10/02/2021 9.3  8.6 - 10.4 mg/dL Final   Total Protein 68/55/2506 6.7  6.1 - 8.1 g/dL Final   Albumin 04/93/3199 4.3  3.6 - 5.1 g/dL Final   Globulin 19/05/778 2.4  1.9 - 3.7 g/dL (calc) Final   AG Ratio 10/02/2021 1.8  1.0 - 2.5 (calc) Final   Total Bilirubin 10/02/2021 0.4  0.2 - 1.2 mg/dL Final   Alkaline phosphatase (APISO) 10/02/2021 59  37 - 153 U/L Final   AST 10/02/2021 17  10 - 35 U/L Final   ALT 10/02/2021 14  6 - 29 U/L Final   Cholesterol 10/02/2021 214 (A)  <200 mg/dL Final   HDL 14/49/7302 79  > OR = 50  mg/dL Final   Triglycerides 08/15/3503 69  <150 mg/dL Final   LDL Cholesterol (Calc) 10/02/2021 119 (A)  mg/dL (calc) Final   Comment: Reference range: <100 . Desirable range <100 mg/dL for primary prevention;   <70 mg/dL for patients with CHD or diabetic patients  with > or = 2 CHD risk factors. Marland Kitchen LDL-C is now calculated using the Martin-Hopkins  calculation, which is a validated novel method providing  better accuracy than the Friedewald equation in the  estimation of LDL-C.  Horald Pollen et al. Lenox Ahr. 0576;366(92): 2061-2068  (http://education.[facility].com/faq/FAQ164)    Total CHOL/HDL Ratio 10/02/2021 2.7  <9.0 (calc) Final   Non-HDL Cholesterol (Calc) 10/02/2021 135 (A)  <130 mg/dL (calc) Final   Comment: For patients with diabetes plus 1 major ASCVD risk  factor, treating to a non-HDL-C goal of <100 mg/dL  (LDL-C of <65 mg/dL) is considered a therapeutic  option.    Hepatitis C Ab 10/02/2021 NON-REACTIVE  NON-REACTIVE Final   SIGNAL TO CUT-OFF 10/02/2021 0.01  <1.00 Final   Comment: . HCV antibody was non-reactive. There is  no laboratory  evidence of HCV infection. . In most cases, no further action is required. However, if recent HCV exposure is suspected, a test for HCV RNA (test code 66711) is suggested. . For additional information please refer to http://education.[facility].com/faq/FAQ22v1 (This link is being provided for informational/ educational purposes only.) .    Lab work is outstanding.  However she would like to get a refill on her Lexapro and Klonopin which she uses for anxiety.  She states that overall she is doing very well she would just like a refill on that medication.  She continues to complain of low back pain.  She states that this is been there for years.  However it is getting worse.  Is now radiating into her posterior right hip and down her right leg to below her right knee.  It sounds like sciatica.  She has not tried any physical therapy or conservative treatment yet Past Medical History:  Diagnosis Date   Allergy    Past Surgical History:  Procedure Laterality Date   TONSILLECTOMY     TUBAL LIGATION     Current Outpatient Medications on File Prior to Visit  Medication Sig Dispense Refill   fluticasone (FLONASE) 50 MCG/ACT nasal spray INHALE 2 SPRAYS INTO EACH NOSTRIL AS NEEDED 48 g 1   levocetirizine (XYZAL) 5 MG tablet TAKE 1 TABLET(5 MG) BY MOUTH DAILY 90 tablet 3   Probiotic Product (ALIGN) 4 MG CAPS 1 capsule daily (Patient taking differently: 1 capsule daily) 30 capsule 2   No current facility-administered medications on file prior to visit.   Allergies  Allergen Reactions   Levaquin [Levofloxacin In D5w]     Unclear reaction in past    Social History   Socioeconomic History   Marital status: Married    Spouse name: Casimiro Needle    Number of children: 4   Years of education: 12+   Highest education level: Not on file  Occupational History   Occupation: Consulting civil engineer   Tobacco Use   Smoking status: Some Days    Packs/day: 0.50    Types: Cigarettes    Smokeless tobacco: Never  Vaping Use   Vaping Use: Never used  Substance and Sexual Activity   Alcohol use: Yes    Alcohol/week: 0.0 standard drinks    Comment: rare -ocassionally   Drug use: No   Sexual activity: Yes    Birth control/protection: Surgical  Comment: 1st intercourse 50 yo-More than 5 partners-BTL  Other Topics Concern   Not on file  Social History Narrative   Patient lives at home with husband Legrand Como    Patient has 4 children    Patient has 12+ years of education.    Patient is right handed.          Social Determinants of Health   Financial Resource Strain: Not on file  Food Insecurity: Not on file  Transportation Needs: Not on file  Physical Activity: Not on file  Stress: Not on file  Social Connections: Not on file  Intimate Partner Violence: Not on file      Review of Systems  All other systems reviewed and are negative.     Objective:   Physical Exam Vitals reviewed.  Constitutional:      General: She is not in acute distress.    Appearance: Normal appearance. She is normal weight. She is not ill-appearing or toxic-appearing.  HENT:     Mouth/Throat:     Mouth: Mucous membranes are moist.     Pharynx: Oropharynx is clear. No oropharyngeal exudate or posterior oropharyngeal erythema.  Eyes:     Extraocular Movements: Extraocular movements intact.     Pupils: Pupils are equal, round, and reactive to light.  Cardiovascular:     Rate and Rhythm: Normal rate and regular rhythm.     Pulses: Normal pulses.     Heart sounds: Normal heart sounds. No murmur heard. Pulmonary:     Effort: Pulmonary effort is normal. No respiratory distress.     Breath sounds: Normal breath sounds. No stridor. No wheezing, rhonchi or rales.  Musculoskeletal:     Cervical back: Normal range of motion and neck supple. No rigidity.  Lymphadenopathy:     Cervical: No cervical adenopathy.  Neurological:     General: No focal deficit present.     Mental Status: She  is alert and oriented to person, place, and time. Mental status is at baseline.     Cranial Nerves: No cranial nerve deficit.     Sensory: No sensory deficit.     Motor: No weakness.     Coordination: Coordination normal.     Gait: Gait normal.     Deep Tendon Reflexes: Reflexes normal.   +   Assessment & Plan:  Screening mammogram for breast cancer - Plan: MM Digital Screening  Screening for malignant neoplasm of the rectum - Plan: Cologuard  Special screening for malignant neoplasms, colon - Plan: Cologuard  Need for immunization against influenza - Plan: Flu Vaccine QUAD 63mo+IM (Fluarix, Fluzone & Alfiuria Quad PF)  Right sided sciatica - Plan: Ambulatory referral to Physical Therapy I will gladly refill the patient's Lexapro and Klonopin as this seems to be doing a good job to control her anxiety.  I will schedule the patient for a mammogram.  I will schedule her for Cologuard to screen for colon cancer.  I recommended a flu shot.  She received her flu shot today.  Also recommended that she schedule herself for a Pap smear.  We will consult physical therapy given her chronic right-sided sciatica.  If she fails conservative treatment, the next step would be an MRI of the lumbar spine

## 2021-10-11 ENCOUNTER — Other Ambulatory Visit: Payer: Self-pay | Admitting: Family Medicine

## 2021-10-17 ENCOUNTER — Ambulatory Visit (INDEPENDENT_AMBULATORY_CARE_PROVIDER_SITE_OTHER): Admitting: Nurse Practitioner

## 2021-10-17 ENCOUNTER — Other Ambulatory Visit: Payer: Self-pay

## 2021-10-17 ENCOUNTER — Telehealth: Payer: Self-pay | Admitting: *Deleted

## 2021-10-17 ENCOUNTER — Encounter: Payer: Self-pay | Admitting: Nurse Practitioner

## 2021-10-17 VITALS — BP 122/78 | HR 83 | Temp 97.5°F | Wt 141.8 lb

## 2021-10-17 DIAGNOSIS — Z124 Encounter for screening for malignant neoplasm of cervix: Secondary | ICD-10-CM | POA: Diagnosis not present

## 2021-10-17 DIAGNOSIS — Z01419 Encounter for gynecological examination (general) (routine) without abnormal findings: Secondary | ICD-10-CM

## 2021-10-17 NOTE — Telephone Encounter (Signed)
Patient received call from [place] on [address]   Inquired if she could have referral sent to [facility] in [place].

## 2021-10-17 NOTE — Progress Notes (Signed)
Subjective:    Patient ID: April Mcmillan, female    DOB: 06/22/1971, 50 y.o.   MRN: [id number]  HPI: April Mcmillan is a 50 y.o. female presenting for  Chief Complaint  Patient presents with   Gynecologic Exam    PAP/ breast exam   Patient is here for gynecologic examination.  She denies gynecologic concerns or complaints today.  We discussed self breast exams.  Allergies  Allergen Reactions   Levaquin [Levofloxacin In D5w]     Unclear reaction in past     Outpatient Encounter Medications as of 10/17/2021  Medication Sig   clonazePAM (KLONOPIN) 0.5 MG tablet Take 1 tablet (0.5 mg total) by mouth 3 (three) times daily as needed for anxiety.   escitalopram (LEXAPRO) 10 MG tablet TAKE 1 TABLET(10 MG) BY MOUTH DAILY   fluticasone (FLONASE) 50 MCG/ACT nasal spray INHALE 2 SPRAYS INTO EACH NOSTRIL AS NEEDED   levocetirizine (XYZAL) 5 MG tablet TAKE 1 TABLET(5 MG) BY MOUTH DAILY   olopatadine (PATANOL) 0.1 % ophthalmic solution Place 1 drop into both eyes 2 (two) times daily as needed for allergies.   Probiotic Product (ALIGN) 4 MG CAPS 1 capsule daily (Patient taking differently: 1 capsule daily)   No facility-administered encounter medications on file as of 10/17/2021.    Patient Active Problem List   Diagnosis Date Noted   Recurrent sinus infections 03/27/2014   Sinusitis, acute 09/07/2013   Seasonal allergies     Past Medical History:  Diagnosis Date   Allergy     Relevant past medical, surgical, family and social history reviewed and updated as indicated. Interim medical history since our last visit reviewed.  Review of Systems Per HPI unless specifically indicated above     Objective:    BP 122/78   Pulse 83   Temp (!) 97.5 F (36.4 C)   Wt 141 lb 12.8 oz (64.3 kg)   SpO2 96%   BMI 26.79 kg/m   Wt Readings from Last 3 Encounters:  10/17/21 141 lb 12.8 oz (64.3 kg)  10/10/21 141 lb (64 kg)  08/09/19 141 lb (64 kg)    Physical Exam Vitals and nursing  note reviewed. Exam conducted with a chaperone present (KG).  Constitutional:      General: She is not in acute distress.    Appearance: Normal appearance. She is not toxic-appearing.  Genitourinary:    General: Normal vulva.     Exam position: Lithotomy position.     Pubic Area: No rash.      Labia:        Right: No rash, tenderness or lesion.        Left: No rash, tenderness or lesion.      Vagina: No vaginal discharge.     Cervix: Normal.     Uterus: Normal.      Adnexa: Right adnexa normal and left adnexa normal.  Lymphadenopathy:     Lower Body: No right inguinal adenopathy. No left inguinal adenopathy.  Skin:    General: Skin is warm and dry.     Coloration: Skin is not jaundiced or pale.     Findings: No erythema.  Neurological:     Mental Status: She is alert and oriented to person, place, and time.  Psychiatric:        Mood and Affect: Mood normal.        Behavior: Behavior normal.        Thought Content: Thought content normal.  Judgment: Judgment normal.      Assessment & Plan:   Problem List Items Addressed This Visit   None Visit Diagnoses     Encounter for gynecological examination (general) (routine) without abnormal findings    -  Primary   Discussed and encouraged self breast exams.    Screening for cervical cancer       Relevant Orders   PAP,TP IMGw/HPV RNA,rflx [id number],59/93        Follow up plan: Return for as scheduled with pcp.

## 2021-10-21 ENCOUNTER — Encounter: Payer: Self-pay | Admitting: *Deleted

## 2021-10-21 LAB — PAP, TP IMAGING W/ HPV RNA, RFLX HPV TYPE 16,18/45: HPV DNA High Risk: NOT DETECTED

## 2021-10-30 LAB — COLOGUARD: COLOGUARD: NEGATIVE

## 2021-12-11 ENCOUNTER — Ambulatory Visit

## 2022-01-02 ENCOUNTER — Ambulatory Visit

## 2022-01-16 ENCOUNTER — Ambulatory Visit

## 2022-01-24 ENCOUNTER — Ambulatory Visit
Admission: RE | Admit: 2022-01-24 | Discharge: 2022-01-24 | Disposition: A | Discharge status: Home / Self Care | Source: Ambulatory Visit | Attending: Family Medicine | Admitting: Family Medicine

## 2022-01-24 DIAGNOSIS — Z1231 Encounter for screening mammogram for malignant neoplasm of breast: Secondary | ICD-10-CM

## 2022-01-29 ENCOUNTER — Other Ambulatory Visit: Payer: Self-pay | Admitting: Family Medicine

## 2022-01-29 DIAGNOSIS — R928 Other abnormal and inconclusive findings on diagnostic imaging of breast: Secondary | ICD-10-CM

## 2022-02-13 ENCOUNTER — Ambulatory Visit
Admission: RE | Admit: 2022-02-13 | Discharge: 2022-02-13 | Disposition: A | Discharge status: Home / Self Care | Source: Ambulatory Visit | Attending: Family Medicine | Admitting: Family Medicine

## 2022-02-13 ENCOUNTER — Ambulatory Visit

## 2022-02-13 DIAGNOSIS — R928 Other abnormal and inconclusive findings on diagnostic imaging of breast: Secondary | ICD-10-CM

## 2022-04-07 ENCOUNTER — Other Ambulatory Visit: Payer: Self-pay | Admitting: Family Medicine

## 2022-04-07 DIAGNOSIS — J3089 Other allergic rhinitis: Secondary | ICD-10-CM

## 2022-04-07 DIAGNOSIS — J324 Chronic pansinusitis: Secondary | ICD-10-CM

## 2022-07-04 ENCOUNTER — Other Ambulatory Visit: Payer: Self-pay | Admitting: Family Medicine

## 2022-07-04 NOTE — Telephone Encounter (Signed)
Requested Prescriptions  Pending Prescriptions Disp Refills  . levocetirizine (XYZAL) 5 MG tablet [Pharmacy Med Name: LEVOCETIRIZINE 5MG TABLETS] 90 tablet 0    Sig: TAKE 1 TABLET(5 MG) BY MOUTH DAILY     Ear, Nose, and Throat:  Antihistamines - levocetirizine dihydrochloride Passed - 07/04/2022 10:16 AM      Passed - Cr in normal range and within 360 days    Creat  Date Value Ref Range Status  10/02/2021 0.73 0.50 - 1.03 mg/dL Final         Passed - eGFR is 10 or above and within 360 days    GFR, Est African American  Date Value Ref Range Status  08/09/2019 116 > OR = 60 mL/min/1.30m Final   GFR, Est Non African American  Date Value Ref Range Status  08/09/2019 100 > OR = 60 mL/min/1.780mFinal   eGFR  Date Value Ref Range Status  10/02/2021 99 > OR = 60 mL/min/1.7353minal    Comment:    The eGFR is based on the CKD-EPI 2021 equation. To calculate  the new eGFR from a previous Creatinine or Cystatin C result, go to https://www.kidney.org/professionals/ kdoqi/gfr%5Fcalculator          Passed - Valid encounter within last 12 months    Recent Outpatient Visits          8 months ago Screening mammogram for breast cancer   [facility] FrizzleD   2 years ago GAD (generalized anxiety disorder)   BroWinfieldckard, WarCammie McgeeD   2 years ago AnxLaytoncDennard SchaumannarCammie McgeeD   3 years ago Multiple somatic complaints   BroHomerckard, WarCammie McgeeD   3 years ago Pansinusitis, unspecified chronicity   BroJeffersonpDelsa GranaA-C

## 2022-10-06 ENCOUNTER — Other Ambulatory Visit: Payer: Self-pay | Admitting: Family Medicine

## 2022-11-09 IMAGING — MG MM DIGITAL SCREENING BILAT W/ TOMO AND CAD
8 series · 9 of 24 positions shown · non-contrast
Comparison: None.

CLINICAL DATA: Screening.

EXAM:
DIGITAL SCREENING BILATERAL MAMMOGRAM WITH TOMOSYNTHESIS AND CAD
TECHNIQUE: Bilateral screening digital craniocaudal and mediolateral oblique
mammograms were obtained. Bilateral screening digital breast
tomosynthesis was performed. The images were evaluated with
computer-aided detection.

[L MLO synth-2D]
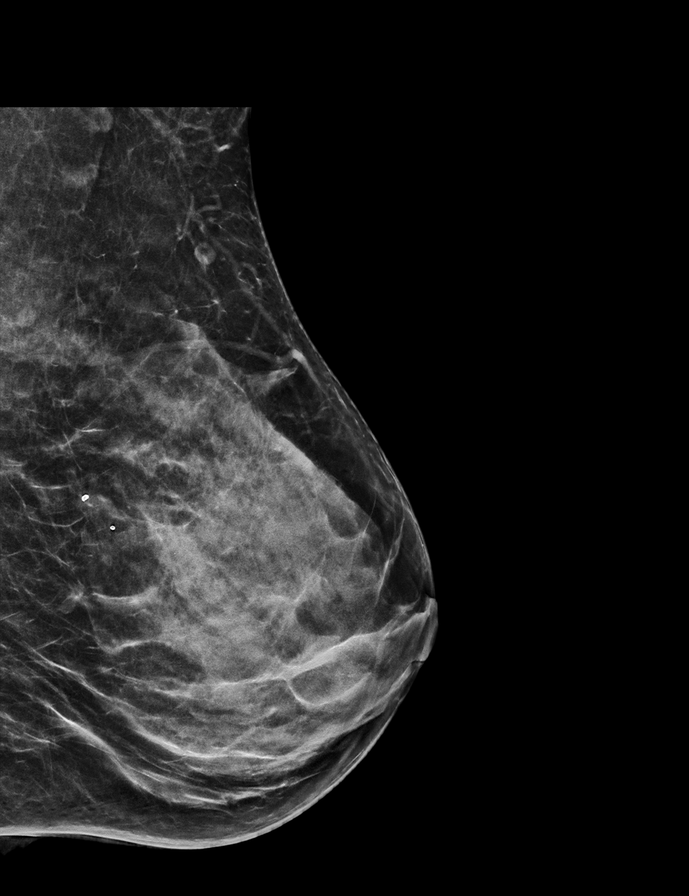

[R CC synth-2D]
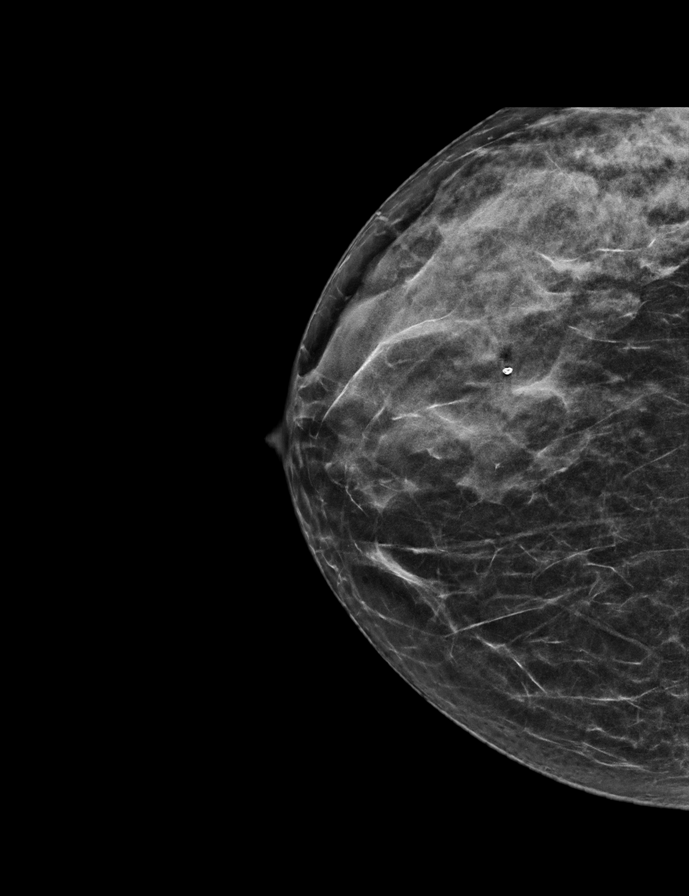

[L CC synth-2D]
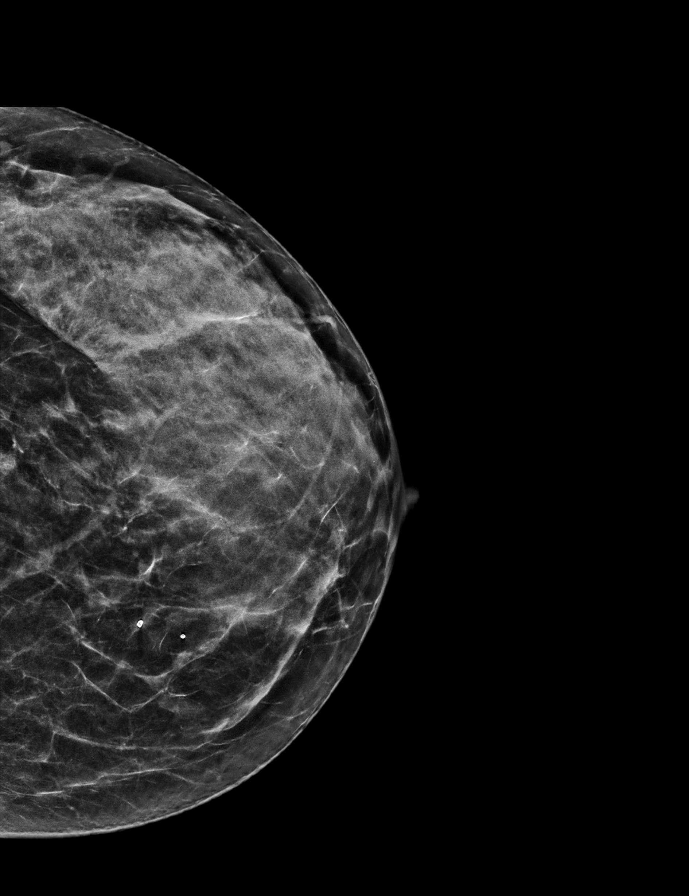

[R MLO synth-2D]
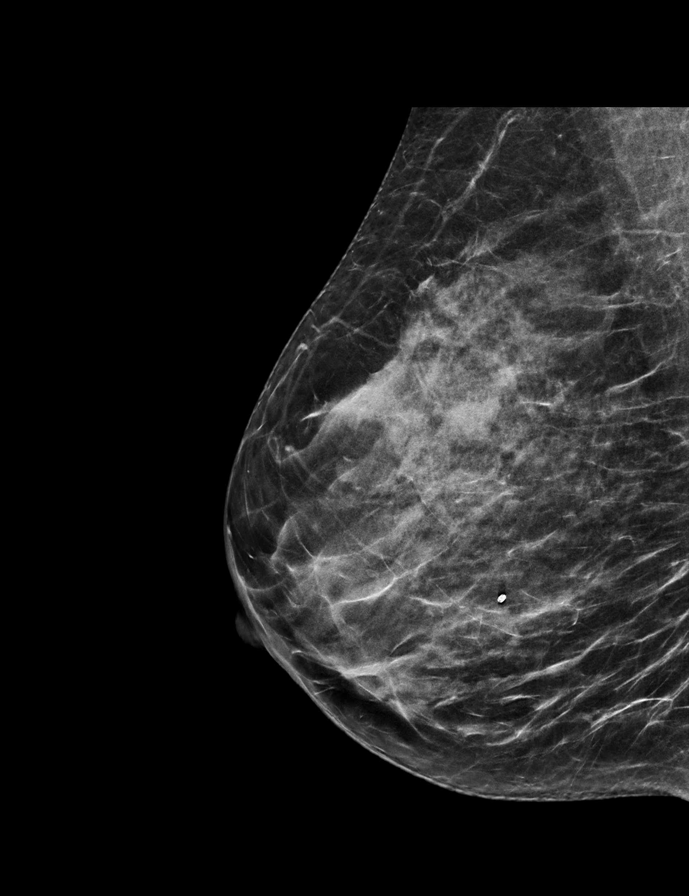

[L CC tomo · 2 of 50 frames shown]
[frame 17/50]
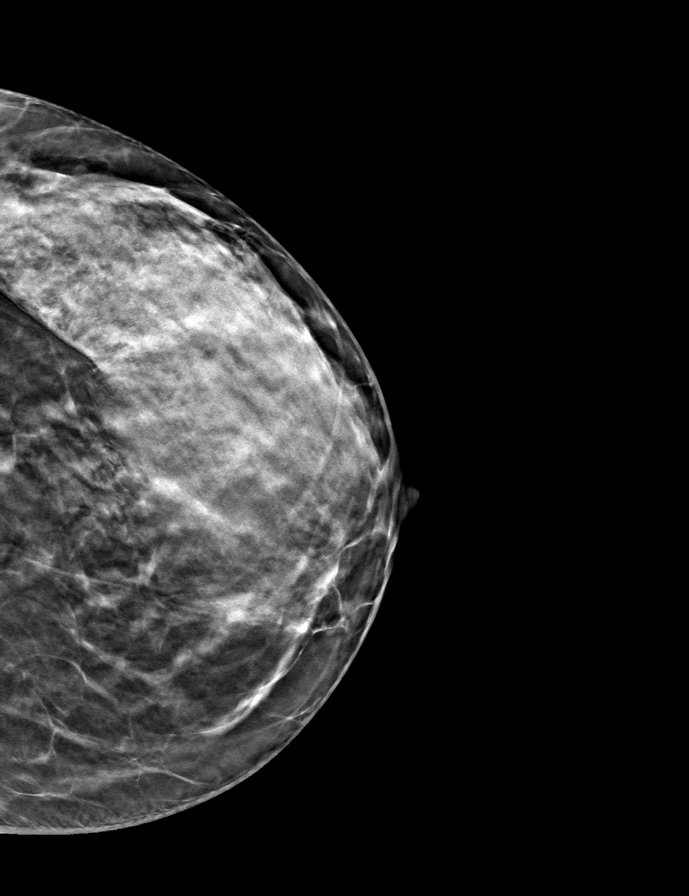
[frame 25/50]
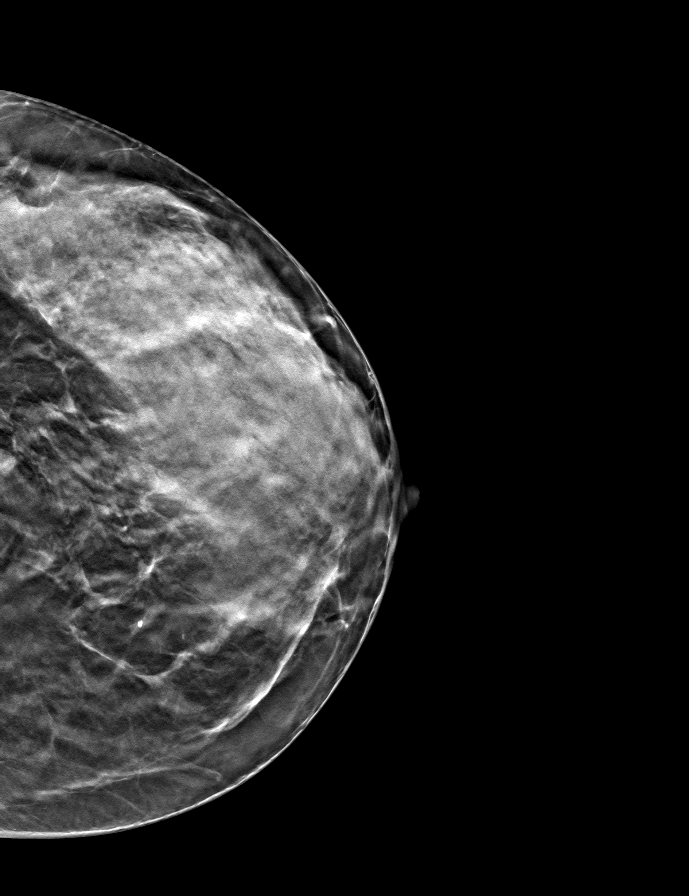

[L MLO tomo · tomo slice 27/53.0]
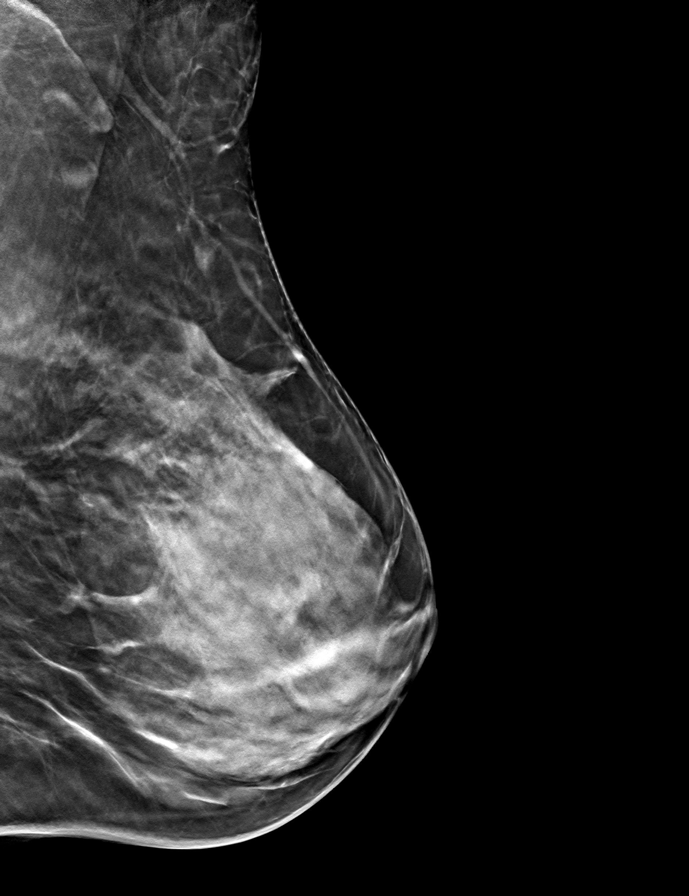

[R MLO tomo · tomo slice 27/54.0]
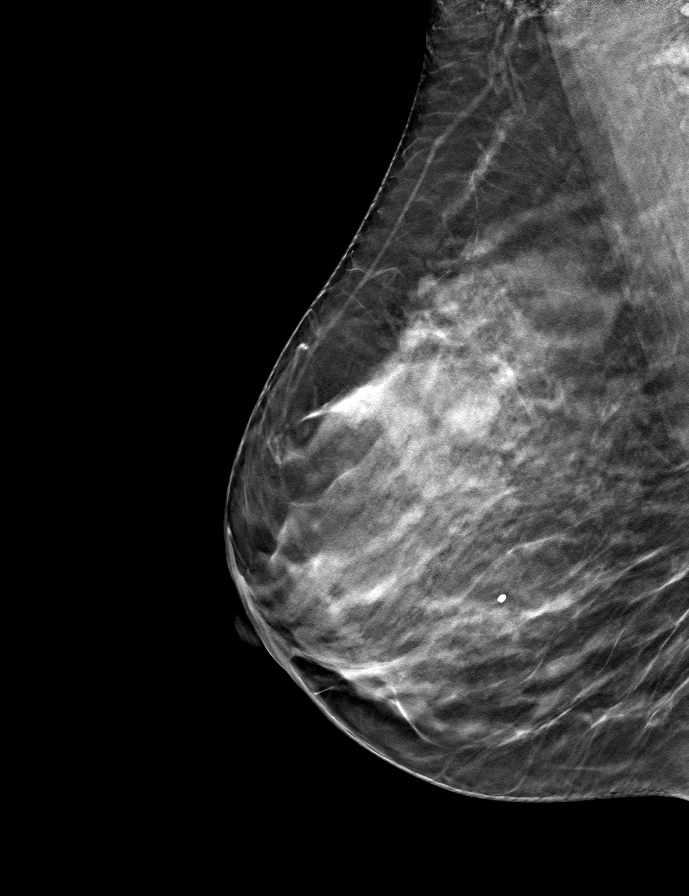

[R CC tomo · tomo slice 27/53.0]
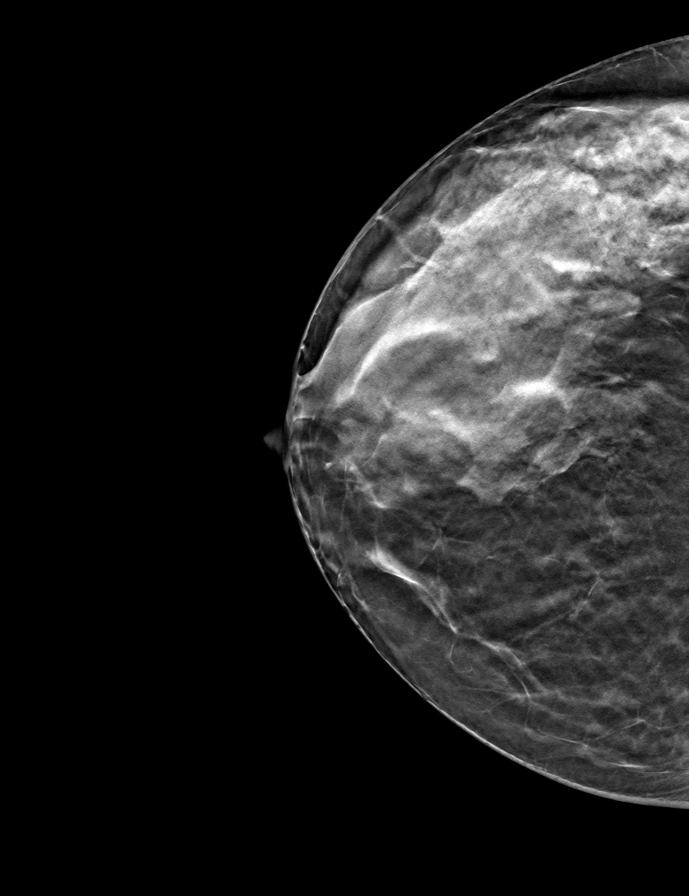

[9 of 24 positions shown; findings below may reference images not displayed]

ACR Breast Density Category d: The breast tissue is extremely dense,
which lowers the sensitivity of mammography.
FINDINGS: In the left breast, a possible asymmetry warrants further
evaluation. In the right breast, no findings suspicious for
malignancy.
IMPRESSION: Further evaluation is suggested for possible asymmetry in the left
breast.

RECOMMENDATION:
Diagnostic mammogram and possibly ultrasound of the left breast.
(Code:[id number]-B-88D)

The patient will be contacted regarding the findings, and additional
imaging will be scheduled.

BI-RADS CATEGORY  0: Incomplete. Need additional imaging evaluation
and/or prior mammograms for comparison.

## 2022-11-17 ENCOUNTER — Other Ambulatory Visit: Payer: Self-pay | Admitting: Family Medicine

## 2022-11-18 ENCOUNTER — Ambulatory Visit: Admitting: Family Medicine

## 2022-11-18 VITALS — BP 114/62 | HR 100 | Ht 61.0 in | Wt 141.0 lb

## 2022-11-18 DIAGNOSIS — Z23 Encounter for immunization: Secondary | ICD-10-CM

## 2022-11-18 DIAGNOSIS — E7800 Pure hypercholesterolemia, unspecified: Secondary | ICD-10-CM

## 2022-11-18 MED ORDER — ESCITALOPRAM OXALATE 10 MG PO TABS: 10.0000 mg | ORAL_TABLET | Freq: Every day | ORAL | 3 refills | Status: DC

## 2022-11-18 NOTE — Addendum Note (Signed)
Addended by: Randal Buba K on: 11/18/2022 03:49 PM   Modules accepted: Orders

## 2022-11-18 NOTE — Progress Notes (Signed)
Subjective:    Patient ID: April Mcmillan, female    DOB: 29-May-1971, 51 y.o.   MRN: [id number]   Patient is here today for a checkup.  Her Cologuard was performed in 22 and is up-to-date.  Her mammogram was performed in March and is up-to-date.  She is overdue for a Pap smear.  She is due shot.  She would like to receive that today.  She is also due for COVID booster and the shingles vaccine.  She is due for lab work.  She would like to refill the Lexapro as she feels that this medication benefits her dramatically Past Medical History:  Diagnosis Date   Allergy    Past Surgical History:  Procedure Laterality Date   TONSILLECTOMY     TUBAL LIGATION     Current Outpatient Medications on File Prior to Visit  Medication Sig Dispense Refill   fluticasone (FLONASE) 50 MCG/ACT nasal spray INHALE 2 SPRAYS INTO EACH NOSTRIL AS NEEDED 48 g 1   levocetirizine (XYZAL) 5 MG tablet TAKE 1 TABLET(5 MG) BY MOUTH DAILY 90 tablet 0   olopatadine (PATANOL) 0.1 % ophthalmic solution Place 1 drop into both eyes 2 (two) times daily as needed for allergies. 5 mL 12   Probiotic Product (ALIGN) 4 MG CAPS 1 capsule daily (Patient taking differently: 1 capsule daily) 30 capsule 2   No current facility-administered medications on file prior to visit.   Allergies  Allergen Reactions   Levaquin [Levofloxacin In D5w]     Unclear reaction in past    Social History   Socioeconomic History   Marital status: Married    Spouse name: Legrand Como    Number of children: 4   Years of education: 12+   Highest education level: Not on file  Occupational History   Occupation: Ship broker   Tobacco Use   Smoking status: Some Days    Packs/day: 0.50    Types: Cigarettes   Smokeless tobacco: Never  Vaping Use   Vaping Use: Never used  Substance and Sexual Activity   Alcohol use: Yes    Alcohol/week: 0.0 standard drinks of alcohol    Comment: rare -ocassionally   Drug use: No   Sexual activity: Yes    Birth  control/protection: Surgical    Comment: 1st intercourse 51 yo-More than 5 partners-BTL  Other Topics Concern   Not on file  Social History Narrative   Patient lives at home with husband Legrand Como    Patient has 4 children    Patient has 12+ years of education.    Patient is right handed.          Social Determinants of Health   Financial Resource Strain: Not on file  Food Insecurity: Not on file  Transportation Needs: Not on file  Physical Activity: Not on file  Stress: Not on file  Social Connections: Not on file  Intimate Partner Violence: Not on file      Review of Systems  All other systems reviewed and are negative.      Objective:   Physical Exam Vitals reviewed.  Constitutional:      General: She is not in acute distress.    Appearance: Normal appearance. She is normal weight. She is not ill-appearing or toxic-appearing.  HENT:     Mouth/Throat:     Mouth: Mucous membranes are moist.     Pharynx: Oropharynx is clear. No oropharyngeal exudate or posterior oropharyngeal erythema.  Eyes:     Extraocular Movements: Extraocular movements  intact.     Pupils: Pupils are equal, round, and reactive to light.  Cardiovascular:     Rate and Rhythm: Normal rate and regular rhythm.     Pulses: Normal pulses.     Heart sounds: Normal heart sounds. No murmur heard. Pulmonary:     Effort: Pulmonary effort is normal. No respiratory distress.     Breath sounds: Normal breath sounds. No stridor. No wheezing, rhonchi or rales.  Musculoskeletal:     Cervical back: Normal range of motion and neck supple. No rigidity.  Lymphadenopathy:     Cervical: No cervical adenopathy.  Neurological:     General: No focal deficit present.     Mental Status: She is alert and oriented to person, place, and time. Mental status is at baseline.     Cranial Nerves: No cranial nerve deficit.     Sensory: No sensory deficit.     Motor: No weakness.     Coordination: Coordination normal.      Gait: Gait normal.     Deep Tendon Reflexes: Reflexes normal.    +   Assessment & Plan:  Pure hypercholesterolemia - Plan: CBC with Differential/Platelet, COMPLETE METABOLIC PANEL WITH GFR, Lipid panel I will gladly refill the patient's Lexapro and Klonopin as this seems to be doing a good job to control her anxiety.  Patient received her flu shot today.  She does have a cerumen impaction in her left ear and we rinsed this out.  I will check a CBC a CMP and a lipid panel.  Recommend a Pap smear for her convenience.

## 2022-11-19 LAB — COMPLETE METABOLIC PANEL WITH GFR
AG Ratio: 2 (calc) (ref 1.0–2.5)
ALT: 15 U/L (ref 6–29)
AST: 15 U/L (ref 10–35)
Albumin: 4.4 g/dL (ref 3.6–5.1)
Alkaline phosphatase (APISO): 62 U/L (ref 37–153)
BUN: 11 mg/dL (ref 7–25)
CO2: 28 mmol/L (ref 20–32)
Calcium: 9.6 mg/dL (ref 8.6–10.4)
Chloride: 107 mmol/L (ref 98–110)
Creat: 0.8 mg/dL (ref 0.50–1.03)
Globulin: 2.2 g/dL (calc) (ref 1.9–3.7)
Glucose, Bld: 70 mg/dL (ref 65–99)
Potassium: 4.1 mmol/L (ref 3.5–5.3)
Sodium: 142 mmol/L (ref 135–146)
Total Bilirubin: 0.3 mg/dL (ref 0.2–1.2)
Total Protein: 6.6 g/dL (ref 6.1–8.1)
eGFR: 88 mL/min/{1.73_m2} (ref >=60)

## 2022-11-19 LAB — CBC WITH DIFFERENTIAL/PLATELET
Absolute Monocytes: 416 cells/uL (ref 200–950)
Basophils Absolute: 51 cells/uL (ref 0–200)
Basophils Relative: 0.8 %
Eosinophils Absolute: 51 cells/uL (ref 15–500)
Eosinophils Relative: 0.8 %
HCT: 37.7 % (ref 35.0–45.0)
Hemoglobin: 12.7 g/dL (ref 11.7–15.5)
Lymphs Abs: 2048 cells/uL (ref 850–3900)
MCH: 31.1 pg (ref 27.0–33.0)
MCHC: 33.7 g/dL (ref 32.0–36.0)
MCV: 92.4 fL (ref 80.0–100.0)
MPV: 11.3 fL (ref 7.5–12.5)
Monocytes Relative: 6.5 %
Neutro Abs: 3834 cells/uL (ref 1500–7800)
Neutrophils Relative %: 59.9 %
Platelets: 231 10*3/uL (ref 140–400)
RBC: 4.08 10*6/uL (ref 3.80–5.10)
RDW: 11.5 % (ref 11.0–15.0)
Total Lymphocyte: 32 %
WBC: 6.4 10*3/uL (ref 3.8–10.8)

## 2022-11-19 LAB — LIPID PANEL
Cholesterol: 220 mg/dL — ABNORMAL HIGH (ref <200)
HDL: 80 mg/dL (ref >=50)
LDL Cholesterol (Calc): 115 mg/dL (calc) — ABNORMAL HIGH
Non-HDL Cholesterol (Calc): 140 mg/dL (calc) — ABNORMAL HIGH (ref <130)
Total CHOL/HDL Ratio: 2.8 (calc) (ref <5.0)
Triglycerides: 140 mg/dL (ref <150)

## 2022-12-18 ENCOUNTER — Other Ambulatory Visit: Payer: Self-pay | Admitting: Family Medicine

## 2022-12-25 ENCOUNTER — Encounter: Payer: Self-pay | Admitting: Family Medicine

## 2022-12-25 ENCOUNTER — Ambulatory Visit (INDEPENDENT_AMBULATORY_CARE_PROVIDER_SITE_OTHER): Admitting: Family Medicine

## 2022-12-25 VITALS — BP 100/62 | HR 79 | Temp 97.5°F | Ht 61.0 in | Wt 141.0 lb

## 2022-12-25 DIAGNOSIS — Z124 Encounter for screening for malignant neoplasm of cervix: Secondary | ICD-10-CM

## 2022-12-25 DIAGNOSIS — N9489 Other specified conditions associated with female genital organs and menstrual cycle: Secondary | ICD-10-CM

## 2022-12-25 DIAGNOSIS — Z01411 Encounter for gynecological examination (general) (routine) with abnormal findings: Secondary | ICD-10-CM

## 2022-12-25 NOTE — Progress Notes (Signed)
   Subjective:   April Mcmillan is a 52 y.o. female for annual routine Pap and checkup. Concerns today include none.   Current Outpatient Medications  Medication Sig Dispense Refill   escitalopram (LEXAPRO) 10 MG tablet TAKE 1 TABLET(10 MG) BY MOUTH DAILY 30 tablet 0   fluticasone (FLONASE) 50 MCG/ACT nasal spray INHALE 2 SPRAYS INTO EACH NOSTRIL AS NEEDED 48 g 1   levocetirizine (XYZAL) 5 MG tablet TAKE 1 TABLET(5 MG) BY MOUTH DAILY 90 tablet 0   olopatadine (PATANOL) 0.1 % ophthalmic solution Place 1 drop into both eyes 2 (two) times daily as needed for allergies. 5 mL 12   Probiotic Product (ALIGN) 4 MG CAPS 1 capsule daily (Patient taking differently: 1 capsule daily) 30 capsule 2   No current facility-administered medications for this visit.   Allergies: Levaquin [levofloxacin in d5w]  No LMP recorded. (Menstrual status: Perimenopausal).  ROS:  Feeling well. No dyspnea or chest pain on exertion.  No abdominal pain, change in bowel habits, black or bloody stools.  No urinary tract symptoms. GYN ROS: normal menses, no abnormal bleeding, pelvic pain or discharge, no breast pain or new or enlarging lumps on self exam. No neurological complaints.  Objective:   The patient appears well, alert, oriented x 3, in no distress. BP 100/62   Pulse 79   Temp (!) 97.5 F (36.4 C) (Oral)   Ht '5\' 1"'$  (1.549 m)   Wt 141 lb (64 kg)   SpO2 97%   BMI 26.64 kg/m  Abdomen soft without tenderness, guarding, mass or organomegaly. Extremities show no edema. Neurological is normal, no focal findings.  BREAST EXAM: breasts appear normal, no suspicious masses, no skin or nipple changes or axillary nodes  PELVIC EXAM: normal external genitalia, vulva, vagina, cervix, uterus and right adnexa, left adnexal mass palpated  Assessment & Plan:   Well woman exam completed today. Discussed findings of palpable left adnexa on bimanual exam with patient and transvaginal US ordered.  PLAN:  Mammogram in  March pap smear completed return annually or prn    Encounter for gynecological examination (general) (routine) with abnormal findings Assessment & Plan: Routine PAP completed today. No concerns of patient today. Left adnexal mass palpated on bimanual exam, will order transvaginal ultrasound for further evaluation. Follow up PAP in 3/5 years based on cervical cytology results.   Cervical cancer screening -     Pap, TP Imaging w/ CT/GC and w/ HPV RNA, rflx HPV Type 16/18  Adnexal mass -     US OB Transvaginal; Future     Follow up plan: Return as scheduled with PCP.  Rubie Maid, FNP

## 2022-12-25 NOTE — Assessment & Plan Note (Signed)
Routine PAP completed today. No concerns of patient today. Left adnexal mass palpated on bimanual exam, will order transvaginal ultrasound for further evaluation. Follow up PAP in 3/5 years based on cervical cytology results.

## 2022-12-27 LAB — PAP, TP IMAGING W/ HPV RNA, RFLX HPV TYPE 16,18/45: HPV DNA High Risk: NOT DETECTED

## 2022-12-27 LAB — C. TRACHOMATIS/N. GONORRHOEAE RNA
C. trachomatis RNA, TMA: NOT DETECTED
N. gonorrhoeae RNA, TMA: NOT DETECTED

## 2022-12-27 LAB — PAP, TP IMAGING W/ CT/GC AND W/ HPV RNA, RFLX HPV TYPE 16/18

## 2023-01-22 ENCOUNTER — Ambulatory Visit
Admission: RE | Admit: 2023-01-22 | Discharge: 2023-01-22 | Disposition: A | Discharge status: Home / Self Care | Source: Ambulatory Visit | Attending: Family Medicine | Admitting: Family Medicine

## 2023-01-22 DIAGNOSIS — N9489 Other specified conditions associated with female genital organs and menstrual cycle: Secondary | ICD-10-CM | POA: Insufficient documentation

## 2023-01-23 ENCOUNTER — Other Ambulatory Visit: Payer: Self-pay | Admitting: Family Medicine

## 2023-03-17 ENCOUNTER — Other Ambulatory Visit: Payer: Self-pay | Admitting: Family Medicine

## 2023-03-17 DIAGNOSIS — J3089 Other allergic rhinitis: Secondary | ICD-10-CM

## 2023-03-17 DIAGNOSIS — J324 Chronic pansinusitis: Secondary | ICD-10-CM

## 2023-03-18 NOTE — Telephone Encounter (Signed)
LOV 11/18/22.  Requested Prescriptions  Pending Prescriptions Disp Refills   fluticasone (FLONASE) 50 MCG/ACT nasal spray [Pharmacy Med Name: FLUTICASONE NASAL SP (120) RX] 48 g 0    Sig: INSTILL 2 SPRAYS INTO EACH NOSTRIL AS NEEDED     Ear, Nose, and Throat: Nasal Preparations - Corticosteroids Failed - 03/17/2023  5:50 PM      Failed - Valid encounter within last 12 months    Recent Outpatient Visits           1 year ago Screening mammogram for breast cancer   [facility] Donita Brooks, MD   3 years ago GAD (generalized anxiety disorder)   [facility] Pickard, Priscille Heidelberg, MD   3 years ago Anxiety   [facility] Pickard, Priscille Heidelberg, MD   3 years ago Multiple somatic complaints   [facility] Pickard, Priscille Heidelberg, MD   4 years ago Pansinusitis, unspecified chronicity   [facility] Danelle Berry, PA-C

## 2023-04-23 ENCOUNTER — Other Ambulatory Visit: Payer: Self-pay | Admitting: Family Medicine

## 2023-04-23 NOTE — Telephone Encounter (Signed)
Requested Prescriptions  Pending Prescriptions Disp Refills   levocetirizine (XYZAL) 5 MG tablet [Pharmacy Med Name: LEVOCETIRIZINE 5MG  TABLETS] 90 tablet 1    Sig: TAKE 1 TABLET(5 MG) BY MOUTH DAILY     Ear, Nose, and Throat:  Antihistamines - levocetirizine dihydrochloride Failed - 04/23/2023  8:03 AM      Failed - Valid encounter within last 12 months    Recent Outpatient Visits           1 year ago Screening mammogram for breast cancer   [facility] Donita Brooks, MD   3 years ago GAD (generalized anxiety disorder)   [facility] Pickard, Priscille Heidelberg, MD   3 years ago Anxiety   [facility] Tanya Nones, Priscille Heidelberg, MD   3 years ago Multiple somatic complaints   [facility] Donita Brooks, MD   4 years ago Pansinusitis, unspecified chronicity   [facility] Danelle Berry, PA-C              Passed - Cr in normal range and within 360 days    Creat  Date Value Ref Range Status  11/18/2022 0.80 0.50 - 1.03 mg/dL Final         Passed - eGFR is 10 or above and within 360 days    GFR, Est African American  Date Value Ref Range Status  08/09/2019 116 > OR = 60 mL/min/1.36m2 Final   GFR, Est Non African American  Date Value Ref Range Status  08/09/2019 100 > OR = 60 mL/min/1.57m2 Final   eGFR  Date Value Ref Range Status  11/18/2022 88 > OR = 60 mL/min/1.82m2 Final

## 2023-06-10 ENCOUNTER — Other Ambulatory Visit: Payer: Self-pay | Admitting: Family Medicine

## 2023-12-04 ENCOUNTER — Other Ambulatory Visit: Payer: Self-pay | Admitting: Family Medicine

## 2024-01-22 ENCOUNTER — Other Ambulatory Visit: Payer: Self-pay | Admitting: Family Medicine

## 2024-01-22 DIAGNOSIS — J324 Chronic pansinusitis: Secondary | ICD-10-CM

## 2024-01-22 DIAGNOSIS — J3089 Other allergic rhinitis: Secondary | ICD-10-CM

## 2024-01-22 NOTE — Telephone Encounter (Signed)
Copied from CRM [id number]. Topic: Clinical - Medication Refill >> Jan 22, 2024  4:36 PM Shelah Lewandowsky wrote: Most Recent Primary Care Visit:  Provider: Kurtis Bushman S  Department: [facility]  Visit Type: PROCEDURE 30  Date: 12/25/2022  Medication: escitalopram (LEXAPRO) 10 MG tablet  fluticasone (FLONASE) 50 MCG/ACT nasal spray levocetirizine (XYZAL) 5 MG tablet   Has the patient contacted their pharmacy? Yes (Agent: If no, request that the patient contact the pharmacy for the refill. If patient does not wish to contact the pharmacy document the reason why and proceed with request.) (Agent: If yes, when and what did the pharmacy advise?)  Is this the correct pharmacy for this prescription? Yes If no, delete pharmacy and type the correct one.  This is the patient's preferred pharmacy:  [id number] - [place], [place] - [address] [place] [address] Phone: [phone] Fax: [phone]   Has the prescription been filled recently? Yes  Is the patient out of the medication? Yes  Has the patient been seen for an appointment in the last year OR does the patient have an upcoming appointment? Yes  Can we respond through MyChart? No  Agent: Please be advised that Rx refills may take up to 3 business days. We ask that you follow-up with your pharmacy.

## 2024-01-25 ENCOUNTER — Telehealth: Payer: Self-pay

## 2024-01-25 ENCOUNTER — Other Ambulatory Visit: Payer: Self-pay | Admitting: Family Medicine

## 2024-01-25 DIAGNOSIS — J3089 Other allergic rhinitis: Secondary | ICD-10-CM

## 2024-01-25 DIAGNOSIS — J324 Chronic pansinusitis: Secondary | ICD-10-CM

## 2024-01-25 MED ORDER — ESCITALOPRAM OXALATE 10 MG PO TABS: 10.0000 mg | ORAL_TABLET | Freq: Every day | ORAL | 1 refills | Status: DC

## 2024-01-25 NOTE — Telephone Encounter (Signed)
 Copied from CRM [id number]. Topic: General - Call Back - No Documentation >> Jan 25, 2024  3:03 PM April Mcmillan wrote: Reason for CRM: Patient is requesting a callback to get emergency medication due to her being completely out. An order was put in on 01/22/24 and again on 01/25/24. She needs escitalopram (LEXAPRO) 10 MG tablet for the emergency refill until prior authorization is done. Callback #: [phone]

## 2024-01-25 NOTE — Telephone Encounter (Signed)
 Copied from CRM [id number]. Topic: Clinical - Medication Refill >> Jan 25, 2024  2:54 PM Higinio Roger wrote: Most Recent Primary Care Visit:  Provider: Kurtis Bushman S  Department: [facility]  Visit Type: PROCEDURE 30  Date: 12/25/2022  Medication: escitalopram (LEXAPRO) 10 MG tablet  fluticasone (FLONASE) 50 MCG/ACT nasal spray  levocetirizine (XYZAL) 5 MG tablet    Has the patient contacted their pharmacy? Yes (Agent: If no, request that the patient contact the pharmacy for the refill. If patient does not wish to contact the pharmacy document the reason why and proceed with request.) (Agent: If yes, when and what did the pharmacy advise?) Pharmacy stated they did not receive authorization to fill Rx  Is this the correct pharmacy for this prescription? Yes If no, delete pharmacy and type the correct one.  This is the patient's preferred pharmacy:   [id number] - [place], [place] - [address] [place] [address] Phone: [phone] Fax: [phone]   Has the prescription been filled recently? No  Is the patient out of the medication? Yes  Has the patient been seen for an appointment in the last year OR does the patient have an upcoming appointment? Yes  Can we respond through MyChart? Yes  Agent: Please be advised that Rx refills may take up to 3 business days. We ask that you follow-up with your pharmacy.

## 2024-02-02 ENCOUNTER — Encounter: Admitting: Family Medicine

## 2024-02-18 ENCOUNTER — Ambulatory Visit (INDEPENDENT_AMBULATORY_CARE_PROVIDER_SITE_OTHER): Admitting: Family Medicine

## 2024-02-18 ENCOUNTER — Encounter: Payer: Self-pay | Admitting: Family Medicine

## 2024-02-18 VITALS — BP 120/80 | HR 79 | Temp 97.9°F | Ht 61.0 in | Wt 141.8 lb

## 2024-02-18 DIAGNOSIS — Z23 Encounter for immunization: Secondary | ICD-10-CM

## 2024-02-18 DIAGNOSIS — E7800 Pure hypercholesterolemia, unspecified: Secondary | ICD-10-CM | POA: Diagnosis not present

## 2024-02-18 DIAGNOSIS — J302 Other seasonal allergic rhinitis: Secondary | ICD-10-CM

## 2024-02-18 DIAGNOSIS — Z122 Encounter for screening for malignant neoplasm of respiratory organs: Secondary | ICD-10-CM | POA: Diagnosis not present

## 2024-02-18 DIAGNOSIS — Z1231 Encounter for screening mammogram for malignant neoplasm of breast: Secondary | ICD-10-CM | POA: Diagnosis not present

## 2024-02-18 DIAGNOSIS — J3089 Other allergic rhinitis: Secondary | ICD-10-CM

## 2024-02-18 DIAGNOSIS — J324 Chronic pansinusitis: Secondary | ICD-10-CM

## 2024-02-18 MED ORDER — LEVOCETIRIZINE DIHYDROCHLORIDE 5 MG PO TABS: 5.0000 mg | ORAL_TABLET | Freq: Every evening | ORAL | 1 refills | Status: DC

## 2024-02-18 MED ORDER — ESCITALOPRAM OXALATE 10 MG PO TABS: 10.0000 mg | ORAL_TABLET | Freq: Every day | ORAL | 1 refills | Status: DC

## 2024-02-18 MED ORDER — MELOXICAM 15 MG PO TABS: 15.0000 mg | ORAL_TABLET | Freq: Every day | ORAL | 5 refills | Status: AC

## 2024-02-18 MED ORDER — FLUTICASONE PROPIONATE 50 MCG/ACT NA SUSP: NASAL | 0 refills | Status: DC

## 2024-02-18 NOTE — Progress Notes (Signed)
 Subjective:    Patient ID: April Mcmillan, female    DOB: March 08, 1971, 53 y.o.   MRN: [id number]   Patient very 53 year old Caucasian female who presents today for complete physical exam.  Her last Pap smear was in 2024.  This is not due again until 2027.  The patient had a Cologuard test in 2022.  This is due to be repeated in December of this year.  Her last mammogram was in 2023.  This is overdue.  She would like me to schedule this.  She is due for a tetanus shot.  She is also due for Prevnar.  She elects to receive Prevnar 20 today.  She also complains of joint pain primarily in her neck as well as in her right knee.  She denies any chest pain or angina.  She does have a history of smoking.  She smokes approximately 1/2 pack of cigarettes a day.  She is been doing so since high school.  Therefore she has over 20-pack-year history of smoking. Past Medical History:  Diagnosis Date   Allergy    Past Surgical History:  Procedure Laterality Date   TONSILLECTOMY     TUBAL LIGATION     Current Outpatient Medications on File Prior to Visit  Medication Sig Dispense Refill   olopatadine (PATANOL) 0.1 % ophthalmic solution Place 1 drop into both eyes 2 (two) times daily as needed for allergies. 5 mL 12   Probiotic Product (ALIGN) 4 MG CAPS 1 capsule daily (Patient taking differently: 1 capsule daily) 30 capsule 2   No current facility-administered medications on file prior to visit.   Allergies  Allergen Reactions   Levaquin [Levofloxacin In D5w]     Unclear reaction in past    Social History   Socioeconomic History   Marital status: Married    Spouse name: Casimiro Needle    Number of children: 4   Years of education: 12+   Highest education level: Not on file  Occupational History   Occupation: Consulting civil engineer   Tobacco Use   Smoking status: Some Days    Current packs/day: 0.50    Types: Cigarettes   Smokeless tobacco: Never  Vaping Use   Vaping status: Never Used  Substance and Sexual Activity    Alcohol use: Yes    Alcohol/week: 0.0 standard drinks of alcohol    Comment: rare -ocassionally   Drug use: No   Sexual activity: Yes    Birth control/protection: Surgical    Comment: 1st intercourse 53 yo-More than 5 partners-BTL  Other Topics Concern   Not on file  Social History Narrative   Patient lives at home with husband Casimiro Needle    Patient has 4 children    Patient has 12+ years of education.    Patient is right handed.          Social Drivers of Corporate investment banker Strain: Not on file  Food Insecurity: Not on file  Transportation Needs: Not on file  Physical Activity: Not on file  Stress: Not on file  Social Connections: Not on file  Intimate Partner Violence: Not on file      Review of Systems  All other systems reviewed and are negative.      Objective:   Physical Exam Vitals reviewed.  Constitutional:      General: She is not in acute distress.    Appearance: Normal appearance. She is normal weight. She is not ill-appearing or toxic-appearing.  HENT:     Mouth/Throat:  Mouth: Mucous membranes are moist.     Pharynx: Oropharynx is clear. No oropharyngeal exudate or posterior oropharyngeal erythema.  Eyes:     Extraocular Movements: Extraocular movements intact.     Pupils: Pupils are equal, round, and reactive to light.  Cardiovascular:     Rate and Rhythm: Normal rate and regular rhythm.     Pulses: Normal pulses.     Heart sounds: Normal heart sounds. No murmur heard. Pulmonary:     Effort: Pulmonary effort is normal. No respiratory distress.     Breath sounds: Normal breath sounds. No stridor. No wheezing, rhonchi or rales.  Musculoskeletal:     Cervical back: Normal range of motion and neck supple. No rigidity.  Lymphadenopathy:     Cervical: No cervical adenopathy.  Neurological:     General: No focal deficit present.     Mental Status: She is alert and oriented to person, place, and time. Mental status is at baseline.      Cranial Nerves: No cranial nerve deficit.     Sensory: No sensory deficit.     Motor: No weakness.     Coordination: Coordination normal.     Gait: Gait normal.     Deep Tendon Reflexes: Reflexes normal.    +   Assessment & Plan:  Seasonal allergies - Plan: escitalopram (LEXAPRO) 10 MG tablet  Pansinusitis, unspecified chronicity - Plan: fluticasone (FLONASE) 50 MCG/ACT nasal spray, levocetirizine (XYZAL) 5 MG tablet  Environmental and seasonal allergies - Plan: fluticasone (FLONASE) 50 MCG/ACT nasal spray  Encounter for screening mammogram for malignant neoplasm of breast - Plan: MM Digital Screening  Pure hypercholesterolemia - Plan: CBC with Differential/Platelet, COMPLETE METABOLIC PANEL WITH GFR, Lipid panel  Screening for lung cancer - Plan: CT CHEST LUNG CA SCREEN LOW DOSE W/O CM I have asked the patient to return fasting for CBC a CMP and a lipid panel.  Patient received Prevnar 20 today.  I will schedule the patient for mammogram.  Cologuard is due at the end of the year.  Pap smear is not due again until 2027.  I will schedule the patient for lung cancer screening with a CAT scan.  Patient defers tetanus shot today.  I will try the patient on Mobic 15 mg daily for osteoarthritis of the right knee.  I cautioned the patient about GI toxicity from the medication.

## 2024-02-24 ENCOUNTER — Other Ambulatory Visit

## 2024-02-25 ENCOUNTER — Ambulatory Visit

## 2024-02-25 ENCOUNTER — Other Ambulatory Visit

## 2024-02-25 LAB — COMPLETE METABOLIC PANEL WITH GFR
AG Ratio: 2.3 (calc) (ref 1.0–2.5)
ALT: 18 U/L (ref 6–29)
AST: 19 U/L (ref 10–35)
Albumin: 4.6 g/dL (ref 3.6–5.1)
Alkaline phosphatase (APISO): 57 U/L (ref 37–153)
BUN: 11 mg/dL (ref 7–25)
CO2: 28 mmol/L (ref 20–32)
Calcium: 9.5 mg/dL (ref 8.6–10.4)
Chloride: 105 mmol/L (ref 98–110)
Creat: 0.7 mg/dL (ref 0.50–1.03)
Globulin: 2 g/dL (ref 1.9–3.7)
Glucose, Bld: 86 mg/dL (ref 65–99)
Potassium: 4.2 mmol/L (ref 3.5–5.3)
Sodium: 141 mmol/L (ref 135–146)
Total Bilirubin: 0.4 mg/dL (ref 0.2–1.2)
Total Protein: 6.6 g/dL (ref 6.1–8.1)

## 2024-02-25 LAB — CBC WITH DIFFERENTIAL/PLATELET
Absolute Lymphocytes: 2165 {cells}/uL (ref 850–3900)
Absolute Monocytes: 462 {cells}/uL (ref 200–950)
Basophils Absolute: 52 {cells}/uL (ref 0–200)
Basophils Relative: 0.8 %
Eosinophils Absolute: 72 {cells}/uL (ref 15–500)
Eosinophils Relative: 1.1 %
HCT: 38.8 % (ref 35.0–45.0)
Hemoglobin: 12.8 g/dL (ref 11.7–15.5)
MCH: 30.3 pg (ref 27.0–33.0)
MCHC: 33 g/dL (ref 32.0–36.0)
MCV: 91.9 fL (ref 80.0–100.0)
MPV: 11 fL (ref 7.5–12.5)
Monocytes Relative: 7.1 %
Neutro Abs: 3751 {cells}/uL (ref 1500–7800)
Neutrophils Relative %: 57.7 %
Platelets: 228 10*3/uL (ref 140–400)
RBC: 4.22 10*6/uL (ref 3.80–5.10)
RDW: 11.6 % (ref 11.0–15.0)
Total Lymphocyte: 33.3 %
WBC: 6.5 10*3/uL (ref 3.8–10.8)

## 2024-02-25 LAB — LIPID PANEL
Cholesterol: 240 mg/dL — ABNORMAL HIGH (ref <200)
HDL: 87 mg/dL (ref >=50)
LDL Cholesterol (Calc): 137 mg/dL — ABNORMAL HIGH
Non-HDL Cholesterol (Calc): 153 mg/dL — ABNORMAL HIGH (ref <130)
Total CHOL/HDL Ratio: 2.8 (calc) (ref <5.0)
Triglycerides: 69 mg/dL (ref <150)

## 2024-03-09 ENCOUNTER — Other Ambulatory Visit

## 2024-03-16 ENCOUNTER — Ambulatory Visit
Admission: RE | Admit: 2024-03-16 | Discharge: 2024-03-16 | Disposition: A | Discharge status: Home / Self Care | Source: Ambulatory Visit | Attending: Family Medicine | Admitting: Family Medicine

## 2024-03-16 DIAGNOSIS — Z1231 Encounter for screening mammogram for malignant neoplasm of breast: Secondary | ICD-10-CM

## 2024-03-23 ENCOUNTER — Other Ambulatory Visit

## 2024-04-18 ENCOUNTER — Other Ambulatory Visit: Payer: Self-pay | Admitting: Family Medicine

## 2024-04-18 DIAGNOSIS — J302 Other seasonal allergic rhinitis: Secondary | ICD-10-CM

## 2024-04-19 NOTE — Telephone Encounter (Signed)
 Requested Prescriptions  Pending Prescriptions Disp Refills   escitalopram  (LEXAPRO ) 10 MG tablet [Pharmacy Med Name: ESCITALOPRAM  10MG  TABLETS] 90 tablet 1    Sig: TAKE 1 TABLET(10 MG) BY MOUTH DAILY     Psychiatry:  Antidepressants - SSRI Failed - 04/19/2024  4:26 PM      Failed - Valid encounter within last 6 months    Recent Outpatient Visits           2 months ago Seasonal allergies   [facility] Austine Lefort, MD   1 year ago Pure hypercholesterolemia   [facility] Pickard, Cisco Crest, MD

## 2024-07-14 ENCOUNTER — Other Ambulatory Visit: Payer: Self-pay | Admitting: Family Medicine

## 2024-07-14 DIAGNOSIS — J324 Chronic pansinusitis: Secondary | ICD-10-CM

## 2024-07-14 DIAGNOSIS — J3089 Other allergic rhinitis: Secondary | ICD-10-CM

## 2024-09-01 ENCOUNTER — Ambulatory Visit: Payer: Self-pay | Admitting: *Deleted

## 2024-09-01 ENCOUNTER — Telehealth: Admitting: Nurse Practitioner

## 2024-09-01 DIAGNOSIS — J019 Acute sinusitis, unspecified: Secondary | ICD-10-CM | POA: Diagnosis not present

## 2024-09-01 DIAGNOSIS — B9689 Other specified bacterial agents as the cause of diseases classified elsewhere: Secondary | ICD-10-CM

## 2024-09-01 MED ORDER — AMOXICILLIN-POT CLAVULANATE 875-125 MG PO TABS: 1.0000 | ORAL_TABLET | Freq: Two times a day (BID) | ORAL | 0 refills | Status: AC

## 2024-09-01 NOTE — Telephone Encounter (Signed)
 Copied from CRM [id number]. Topic: Clinical - Red Word Triage >> Sep 01, 2024 12:55 PM Avram MATSU wrote: Red Word that prompted transfer to Nurse Triage: allergies turning into a sinus infection/ pain and pressure around face and ears, coughing up dark mucus Reason for Disposition  [1] Sinus congestion (pressure, fullness) AND [2] present > 10 days  Answer Assessment - Initial Assessment Questions 1. LOCATION: Where does it hurt?      I'm having sinus congestion and pain and tired, very tired.    Coughing.   Pain in my chest , throat.    It comes and goes. I have seasonal allergies every day.   I thought it was allergies but it has turned bad.   I usually get up and do my normal day.    The last 2 weeks I just want to sleep and I'm in pain around my face and ears.  2. ONSET: When did the sinus pain start?  (e.g., hours, days)      2 weeks ago 3. SEVERITY: How bad is the pain?   (Scale 0-10; or none, mild, moderate or severe)     I'm having pressure around my face and ears. 4. RECURRENT SYMPTOM: Have you ever had sinus problems before? If Yes, ask: When was the last time? and What happened that time?      Yes 5. NASAL CONGESTION: Is the nose blocked? If Yes, ask: Can you open it or must you breathe through your mouth?     Sinus congestion is orange brown color 6. NASAL DISCHARGE: Do you have discharge from your nose? If so ask, What color?     Yes It's different colors of mucus 7. FEVER: Do you have a fever? If Yes, ask: What is it, how was it measured, and when did it start?      No 8. OTHER SYMPTOMS: Do you have any other symptoms? (e.g., sore throat, cough, earache, difficulty breathing)     Ears hurting and facial pressure.    9. PREGNANCY: Is there any chance you are pregnant? When was your last menstrual period?     N/A due to age  Protocols used: Sinus Pain or Congestion-A-AH FYI Only or Action Required?: FYI only for provider.  Patient was last seen in  primary care on 02/18/2024 by Duanne Butler DASEN, MD.  Called Nurse Triage reporting Sinusitis.For 2 weeks now having facial pressure and pain.  Sinus congestion  Symptoms began several weeks ago.  Interventions attempted: OTC medications: several OTC drugs, Flonase , cough medication, Sudephed .  Symptoms are: gradually worsening.  Triage Disposition: See PCP When Office is Open (Within 3 Days)Telehealth appt made for today through [facility] for 3:45.    Pt will call and cancel appt made with Dr Duanne for 09/06/2024  at 2:00 if this works out.     Patient/caregiver understands and will follow disposition?: Yes

## 2024-09-01 NOTE — Progress Notes (Signed)
 Virtual Visit Consent   April Mcmillan, you are scheduled for a virtual visit with a [facility] provider today. Just as with appointments in the office, your consent must be obtained to participate. Your consent will be active for this visit and any virtual visit you may have with one of our providers in the next 365 days. If you have a MyChart account, a copy of this consent can be sent to you electronically.  As this is a virtual visit, video technology does not allow for your provider to perform a traditional examination. This may limit your provider's ability to fully assess your condition. If your provider identifies any concerns that need to be evaluated in person or the need to arrange testing (such as labs, EKG, etc.), we will make arrangements to do so. Although advances in technology are sophisticated, we cannot ensure that it will always work on either your end or our end. If the connection with a video visit is poor, the visit may have to be switched to a telephone visit. With either a video or telephone visit, we are not always able to ensure that we have a secure connection.  By engaging in this virtual visit, you consent to the provision of healthcare and authorize for your insurance to be billed (if applicable) for the services provided during this visit. Depending on your insurance coverage, you may receive a charge related to this service.  I need to obtain your verbal consent now. Are you willing to proceed with your visit today? April Mcmillan has provided verbal consent on 09/01/2024 for a virtual visit (video or telephone). April LELON Servant, NP  Date: 09/01/2024 3:58 PM   Virtual Visit via Video Note   I, April Mcmillan, connected with  April Mcmillan  ([id number], 03/04/1971) on 09/01/24 at  3:45 PM EDT by a video-enabled telemedicine application and verified that I am speaking with the correct person using two identifiers.  Location: Patient: Virtual Visit Location Patient:  Home Provider: Virtual Visit Location Provider: Home Office   I discussed the limitations of evaluation and management by telemedicine and the availability of in person appointments. The patient expressed understanding and agreed to proceed.    History of Present Illness: April Mcmillan is a 53 y.o. who identifies as a female who was assigned female at birth, and is being seen today for sinusitis.  Over the past few weeks [name], bilateral ear pain, fatigue, nasal congestion, sinus drainage unrelieved by saline rinse/mist,  xyzal  and flonase .   Problems:  Patient Active Problem List   Diagnosis Date Noted   Encounter for gynecological examination (general) (routine) with abnormal findings 12/25/2022   Chronic nonintractable headache 04/20/2017   Deviated septum 04/20/2017   Recurrent sinus infections 03/27/2014   Sinusitis, acute 09/07/2013   Seasonal allergies     Allergies:  Allergies  Allergen Reactions   Levaquin [Levofloxacin In D5w]     Unclear reaction in past    Medications:  Current Outpatient Medications:    amoxicillin -clavulanate (AUGMENTIN ) 875-125 MG tablet, Take 1 tablet by mouth 2 (two) times daily for 7 days., Disp: 14 tablet, Rfl: 0   escitalopram  (LEXAPRO ) 10 MG tablet, TAKE 1 TABLET(10 MG) BY MOUTH DAILY, Disp: 90 tablet, Rfl: 1   fluticasone  (FLONASE ) 50 MCG/ACT nasal spray, USE 2 SPRAYS IN EACH NOSTRIL AS NEEDED EVERY DAY, Disp: 48 g, Rfl: 0   levocetirizine (XYZAL ) 5 MG tablet, Take 1 tablet (  5 mg total) by mouth every evening., Disp: 90 tablet, Rfl: 1   meloxicam  (MOBIC ) 15 MG tablet, Take 1 tablet (15 mg total) by mouth daily., Disp: 30 tablet, Rfl: 5   olopatadine  (PATANOL) 0.1 % ophthalmic solution, Place 1 drop into both eyes 2 (two) times daily as needed for allergies., Disp: 5 mL, Rfl: 12   Probiotic Product (ALIGN) 4 MG CAPS, 1 capsule daily (Patient taking differently: 1 capsule  daily), Disp: 30 capsule, Rfl: 2  Observations/Objective: Patient is well-developed, well-nourished in no acute distress.  Resting comfortably at home.  Head is normocephalic, atraumatic.  No labored breathing.  Speech is clear and coherent with logical content.  Patient is alert and oriented at baseline.    Assessment and Plan: 1. Acute bacterial sinusitis (Primary) - amoxicillin -clavulanate (AUGMENTIN ) 875-125 MG tablet; Take 1 tablet by mouth 2 (two) times daily for 7 days.  Dispense: 14 tablet; Refill: 0  Continue xyzal  and flonase   Follow Up Instructions: I discussed the assessment and treatment plan with the patient. The patient was provided an opportunity to ask questions and all were answered. The patient agreed with the plan and demonstrated an understanding of the instructions.  A copy of instructions were sent to the patient via MyChart unless otherwise noted below.     The patient was advised to call back or seek an in-person evaluation if the symptoms worsen or if the condition fails to improve as anticipated.    Metha Kolasa W Han Lysne, NP

## 2024-09-01 NOTE — Patient Instructions (Signed)
  April Mcmillan, thank you for joining Haze LELON Servant, NP for today's virtual visit.  While this provider is not your primary care provider (PCP), if your PCP is located in our provider database this encounter information will be shared with them immediately following your visit.   A [facility] MyChart account gives you access to today's visit and all your visits, tests, and labs performed at [facility]  click here if you don't have a [facility] MyChart account or go to [email or web address]  Consent: (Patient) April Mcmillan provided verbal consent for this virtual visit at the beginning of the encounter.  Current Medications:  Current Outpatient Medications:    amoxicillin -clavulanate (AUGMENTIN ) 875-125 MG tablet, Take 1 tablet by mouth 2 (two) times daily for 7 days., Disp: 14 tablet, Rfl: 0   escitalopram  (LEXAPRO ) 10 MG tablet, TAKE 1 TABLET(10 MG) BY MOUTH DAILY, Disp: 90 tablet, Rfl: 1   fluticasone  (FLONASE ) 50 MCG/ACT nasal spray, USE 2 SPRAYS IN EACH NOSTRIL AS NEEDED EVERY DAY, Disp: 48 g, Rfl: 0   levocetirizine (XYZAL ) 5 MG tablet, Take 1 tablet (5 mg total) by mouth every evening., Disp: 90 tablet, Rfl: 1   meloxicam  (MOBIC ) 15 MG tablet, Take 1 tablet (15 mg total) by mouth daily., Disp: 30 tablet, Rfl: 5   olopatadine  (PATANOL) 0.1 % ophthalmic solution, Place 1 drop into both eyes 2 (two) times daily as needed for allergies., Disp: 5 mL, Rfl: 12   Probiotic Product (ALIGN) 4 MG CAPS, 1 capsule daily (Patient taking differently: 1 capsule daily), Disp: 30 capsule, Rfl: 2   Medications ordered in this encounter:  Meds ordered this encounter  Medications   amoxicillin -clavulanate (AUGMENTIN ) 875-125 MG tablet    Sig: Take 1 tablet by mouth 2 (two) times daily for 7 days.    Dispense:  14 tablet    Refill:  0    Supervising Provider:   BLAISE ALEENE KIDD [[id number]]     *If you need refills on other medications prior to your next appointment, please contact  your pharmacy*  Follow-Up: Call back or seek an in-person evaluation if the symptoms worsen or if the condition fails to improve as anticipated.  [facility] [phone]  Other Instructions Continue xyzal  and flonase    If you have been instructed to have an in-person evaluation today at a local Urgent Care facility, please use the link below. It will take you to a list of all of our available [facility], including address, phone number and hours of operation. Please do not delay care.  [facility]  If you or a family member do not have a primary care provider, use the link below to schedule a visit and establish care. When you choose a [facility] primary care physician or advanced practice provider, you gain a long-term partner in health. Find a Primary Care Provider  Learn more about [facility] in-office and virtual care options:  - Get Care Now

## 2024-09-06 ENCOUNTER — Ambulatory Visit: Admitting: Family Medicine

## 2024-10-08 ENCOUNTER — Other Ambulatory Visit: Payer: Self-pay | Admitting: Family Medicine

## 2024-10-08 DIAGNOSIS — J302 Other seasonal allergic rhinitis: Secondary | ICD-10-CM

## 2024-10-10 NOTE — Telephone Encounter (Signed)
 Courtesy refill. Patient will need an office visit for additional refills.  Requested Prescriptions  Pending Prescriptions Disp Refills   escitalopram  (LEXAPRO ) 10 MG tablet [Pharmacy Med Name: ESCITALOPRAM  10MG  TABLETS] 90 tablet 1    Sig: TAKE 1 TABLET(10 MG) BY MOUTH DAILY     Psychiatry:  Antidepressants - SSRI Failed - 10/10/2024  5:31 PM      Failed - Valid encounter within last 6 months    Recent Outpatient Visits           7 months ago Seasonal allergies   [facility] Pickard, Butler DASEN, MD   1 year ago Pure hypercholesterolemia   [facility] Pickard, Butler DASEN, MD

## 2024-11-10 ENCOUNTER — Other Ambulatory Visit: Payer: Self-pay | Admitting: Family Medicine

## 2024-11-10 DIAGNOSIS — J3089 Other allergic rhinitis: Secondary | ICD-10-CM

## 2024-11-10 DIAGNOSIS — J324 Chronic pansinusitis: Secondary | ICD-10-CM

## 2024-11-12 NOTE — Telephone Encounter (Signed)
 Requested Prescriptions  Pending Prescriptions Disp Refills   fluticasone  (FLONASE ) 50 MCG/ACT nasal spray [Pharmacy Med Name: FLUTICASONE  NASAL SP (120) RX] 48 g 0    Sig: SHAKE LIQUID AND USE 2 SPRAYS IN EACH NOSTRIL EVERY DAY AS NEEDED     Ear, Nose, and Throat: Nasal Preparations - Corticosteroids Passed - 11/12/2024  9:28 PM      Passed - Valid encounter within last 12 months    Recent Outpatient Visits           8 months ago Seasonal allergies   [facility] Pickard, Butler DASEN, MD   1 year ago Pure hypercholesterolemia   [facility] Pickard, Butler DASEN, MD

## 2024-12-16 ENCOUNTER — Other Ambulatory Visit: Payer: Self-pay | Admitting: Family Medicine

## 2024-12-16 DIAGNOSIS — J302 Other seasonal allergic rhinitis: Secondary | ICD-10-CM

## 2024-12-16 DIAGNOSIS — J324 Chronic pansinusitis: Secondary | ICD-10-CM

## 2024-12-19 ENCOUNTER — Other Ambulatory Visit: Payer: Self-pay | Admitting: Family Medicine

## 2024-12-19 DIAGNOSIS — J324 Chronic pansinusitis: Secondary | ICD-10-CM

## 2024-12-19 DIAGNOSIS — J302 Other seasonal allergic rhinitis: Secondary | ICD-10-CM

## 2024-12-19 NOTE — Telephone Encounter (Unsigned)
 Copied from CRM #[id number]. Topic: Clinical - Medication Refill >> Dec 19, 2024  2:54 PM Amy B wrote: Medication:  escitalopram  (LEXAPRO ) 10 MG tablet levocetirizine (XYZAL ) 5 MG tablet  Has the patient contacted their pharmacy? Yes (Agent: If no, request that the patient contact the pharmacy for the refill. If patient does not wish to contact the pharmacy document the reason why and proceed with request.) (Agent: If yes, when and what did the pharmacy advise?)  This is the patient's preferred pharmacy:  [id number] - [place],  - [address] [place] [address] Phone: [phone] Fax: [phone]  Is this the correct pharmacy for this prescription? Yes If no, delete pharmacy and type the correct one.   Has the prescription been filled recently? No  Is the patient out of the medication? Yes  Has the patient been seen for an appointment in the last year OR does the patient have an upcoming appointment? Yes  Can we respond through MyChart? No   Agent: Please be advised that Rx refills may take up to 3 business days. We ask that you follow-up with your pharmacy.

## 2024-12-20 NOTE — Telephone Encounter (Signed)
 Duplicate request, filled 12/19/24 Requested Prescriptions  Pending Prescriptions Disp Refills   escitalopram  (LEXAPRO ) 10 MG tablet 30 tablet 0     Psychiatry:  Antidepressants - SSRI Failed - 12/20/2024  2:29 PM      Failed - Valid encounter within last 6 months    Recent Outpatient Visits           10 months ago Seasonal allergies   [place] [facility] Duanne Butler DASEN, MD   2 years ago Pure hypercholesterolemia   [place] [facility] Duanne, Butler DASEN, MD               levocetirizine (XYZAL ) 5 MG tablet 90 tablet 1     Ear, Nose, and Throat:  Antihistamines - levocetirizine dihydrochloride  Failed - 12/20/2024  2:29 PM      Failed - eGFR is 10 or above and within 360 days    GFR, Est African American  Date Value Ref Range Status  08/09/2019 116 > OR = 60 mL/min/1.53m2 Final   GFR, Est Non African American  Date Value Ref Range Status  08/09/2019 100 > OR = 60 mL/min/1.59m2 Final   eGFR  Date Value Ref Range Status  11/18/2022 88 > OR = 60 mL/min/1.20m2 Final         Passed - Cr in normal range and within 360 days    Creat  Date Value Ref Range Status  02/24/2024 0.70 0.50 - 1.03 mg/dL Final         Passed - Valid encounter within last 12 months    Recent Outpatient Visits           10 months ago Seasonal allergies   [place] [facility] Duanne Butler DASEN, MD   2 years ago Pure hypercholesterolemia   [place] [facility] [place], Butler DASEN, MD

## 2025-01-13 ENCOUNTER — Other Ambulatory Visit: Payer: Self-pay

## 2025-01-13 ENCOUNTER — Other Ambulatory Visit: Payer: Self-pay | Admitting: Family Medicine

## 2025-01-13 DIAGNOSIS — J302 Other seasonal allergic rhinitis: Secondary | ICD-10-CM

## 2025-02-23 ENCOUNTER — Encounter: Admitting: Family Medicine
